# Patient Record
Sex: Female | Born: 1960 | Race: Black or African American | Hispanic: No | Marital: Single | Smoking: Never smoker
Health system: Southern US, Community
[De-identification: ages and names within clinical notes are randomized; demographics above are authoritative.]

## PROBLEM LIST (undated history)

## (undated) DIAGNOSIS — R51 Headache: Secondary | ICD-10-CM

## (undated) DIAGNOSIS — K219 Gastro-esophageal reflux disease without esophagitis: Secondary | ICD-10-CM

## (undated) DIAGNOSIS — E079 Disorder of thyroid, unspecified: Secondary | ICD-10-CM

## (undated) DIAGNOSIS — E669 Obesity, unspecified: Secondary | ICD-10-CM

## (undated) DIAGNOSIS — K5790 Diverticulosis of intestine, part unspecified, without perforation or abscess without bleeding: Secondary | ICD-10-CM

## (undated) DIAGNOSIS — R519 Headache, unspecified: Secondary | ICD-10-CM

## (undated) DIAGNOSIS — K648 Other hemorrhoids: Secondary | ICD-10-CM

## (undated) DIAGNOSIS — K8020 Calculus of gallbladder without cholecystitis without obstruction: Secondary | ICD-10-CM

## (undated) DIAGNOSIS — I10 Essential (primary) hypertension: Secondary | ICD-10-CM

## (undated) DIAGNOSIS — E785 Hyperlipidemia, unspecified: Secondary | ICD-10-CM

## (undated) DIAGNOSIS — H269 Unspecified cataract: Secondary | ICD-10-CM

## (undated) HISTORY — PX: CHOLECYSTECTOMY: SHX55

## (undated) HISTORY — DX: Diverticulosis of intestine, part unspecified, without perforation or abscess without bleeding: K57.90

## (undated) HISTORY — DX: Essential (primary) hypertension: I10

## (undated) HISTORY — DX: Unspecified cataract: H26.9

## (undated) HISTORY — DX: Calculus of gallbladder without cholecystitis without obstruction: K80.20

## (undated) HISTORY — DX: Disorder of thyroid, unspecified: E07.9

## (undated) HISTORY — DX: Headache, unspecified: R51.9

## (undated) HISTORY — DX: Hyperlipidemia, unspecified: E78.5

## (undated) HISTORY — DX: Gastro-esophageal reflux disease without esophagitis: K21.9

## (undated) HISTORY — PX: TONSILLECTOMY: SUR1361

## (undated) HISTORY — DX: Other hemorrhoids: K64.8

## (undated) HISTORY — DX: Obesity, unspecified: E66.9

## (undated) HISTORY — PX: COLONOSCOPY: SHX174

## (undated) HISTORY — DX: Headache: R51

---

## 1999-01-21 ENCOUNTER — Other Ambulatory Visit: Admission: RE | Admit: 1999-01-21 | Discharge: 1999-01-21 | Payer: Self-pay | Admitting: Internal Medicine

## 1999-12-27 ENCOUNTER — Encounter (INDEPENDENT_AMBULATORY_CARE_PROVIDER_SITE_OTHER): Payer: Self-pay | Admitting: Specialist

## 1999-12-27 ENCOUNTER — Encounter: Payer: Self-pay | Admitting: Internal Medicine

## 1999-12-28 ENCOUNTER — Encounter: Payer: Self-pay | Admitting: Surgery

## 1999-12-28 ENCOUNTER — Inpatient Hospital Stay (HOSPITAL_COMMUNITY): Admission: RE | Admit: 1999-12-28 | Discharge: 1999-12-30 | Payer: Self-pay | Admitting: Internal Medicine

## 2000-01-23 ENCOUNTER — Other Ambulatory Visit: Admission: RE | Admit: 2000-01-23 | Discharge: 2000-01-23 | Payer: Self-pay | Admitting: Internal Medicine

## 2000-04-23 ENCOUNTER — Emergency Department (HOSPITAL_COMMUNITY): Admission: EM | Admit: 2000-04-23 | Discharge: 2000-04-23 | Payer: Self-pay | Admitting: Emergency Medicine

## 2001-02-02 ENCOUNTER — Other Ambulatory Visit: Admission: RE | Admit: 2001-02-02 | Discharge: 2001-02-02 | Payer: Self-pay | Admitting: Internal Medicine

## 2002-02-03 ENCOUNTER — Other Ambulatory Visit: Admission: RE | Admit: 2002-02-03 | Discharge: 2002-02-03 | Payer: Self-pay | Admitting: Internal Medicine

## 2002-07-21 ENCOUNTER — Encounter: Payer: Self-pay | Admitting: Internal Medicine

## 2002-07-21 LAB — CONVERTED CEMR LAB

## 2003-02-07 ENCOUNTER — Other Ambulatory Visit: Admission: RE | Admit: 2003-02-07 | Discharge: 2003-02-07 | Payer: Self-pay | Admitting: Internal Medicine

## 2003-02-07 ENCOUNTER — Encounter: Payer: Self-pay | Admitting: Internal Medicine

## 2004-07-30 ENCOUNTER — Ambulatory Visit: Payer: Self-pay | Admitting: Internal Medicine

## 2004-09-06 ENCOUNTER — Ambulatory Visit: Payer: Self-pay | Admitting: Internal Medicine

## 2005-02-03 ENCOUNTER — Ambulatory Visit: Payer: Self-pay | Admitting: Internal Medicine

## 2005-02-12 ENCOUNTER — Ambulatory Visit: Payer: Self-pay | Admitting: Internal Medicine

## 2005-03-05 ENCOUNTER — Encounter: Admission: RE | Admit: 2005-03-05 | Discharge: 2005-03-05 | Payer: Self-pay | Admitting: Internal Medicine

## 2005-05-27 ENCOUNTER — Emergency Department (HOSPITAL_COMMUNITY): Admission: EM | Admit: 2005-05-27 | Discharge: 2005-05-27 | Payer: Self-pay | Admitting: Emergency Medicine

## 2005-09-10 ENCOUNTER — Ambulatory Visit: Payer: Self-pay | Admitting: Internal Medicine

## 2005-10-21 ENCOUNTER — Ambulatory Visit: Payer: Self-pay | Admitting: Internal Medicine

## 2006-01-20 LAB — CONVERTED CEMR LAB: Pap Smear: NORMAL

## 2006-02-05 ENCOUNTER — Ambulatory Visit: Payer: Self-pay | Admitting: Internal Medicine

## 2006-02-11 ENCOUNTER — Other Ambulatory Visit: Admission: RE | Admit: 2006-02-11 | Discharge: 2006-02-11 | Payer: Self-pay | Admitting: Internal Medicine

## 2006-02-11 ENCOUNTER — Ambulatory Visit: Payer: Self-pay | Admitting: Internal Medicine

## 2006-02-11 ENCOUNTER — Encounter: Payer: Self-pay | Admitting: Internal Medicine

## 2006-06-25 ENCOUNTER — Ambulatory Visit: Payer: Self-pay | Admitting: Internal Medicine

## 2006-12-02 ENCOUNTER — Ambulatory Visit: Payer: Self-pay | Admitting: Internal Medicine

## 2007-03-05 ENCOUNTER — Encounter: Payer: Self-pay | Admitting: Internal Medicine

## 2007-03-07 ENCOUNTER — Encounter: Payer: Self-pay | Admitting: Internal Medicine

## 2007-03-07 DIAGNOSIS — F329 Major depressive disorder, single episode, unspecified: Secondary | ICD-10-CM

## 2007-03-07 DIAGNOSIS — E785 Hyperlipidemia, unspecified: Secondary | ICD-10-CM

## 2007-03-07 DIAGNOSIS — J309 Allergic rhinitis, unspecified: Secondary | ICD-10-CM | POA: Insufficient documentation

## 2007-05-27 ENCOUNTER — Ambulatory Visit: Payer: Self-pay | Admitting: Internal Medicine

## 2007-05-27 LAB — CONVERTED CEMR LAB
ALT: 62 units/L — ABNORMAL HIGH (ref 0–35)
AST: 35 units/L (ref 0–37)
Basophils Absolute: 0 10*3/uL (ref 0.0–0.1)
Basophils Relative: 0.6 % (ref 0.0–1.0)
Bilirubin Urine: NEGATIVE
Chloride: 104 meq/L (ref 96–112)
Cholesterol: 228 mg/dL (ref 0–200)
Direct LDL: 152.9 mg/dL
Eosinophils Absolute: 0 10*3/uL (ref 0.0–0.6)
Eosinophils Relative: 0.5 % (ref 0.0–5.0)
GFR calc Af Amer: 117 mL/min
GFR calc non Af Amer: 97 mL/min
HCT: 38.6 % (ref 36.0–46.0)
HDL: 56.8 mg/dL (ref >39.0)
Hemoglobin, Urine: NEGATIVE
MCHC: 34.1 g/dL (ref 30.0–36.0)
MCV: 89.5 fL (ref 78.0–100.0)
Monocytes Absolute: 0.5 10*3/uL (ref 0.2–0.7)
Nitrite: NEGATIVE
Potassium: 4 meq/L (ref 3.5–5.1)
RDW: 13.2 % (ref 11.5–14.6)
TSH: 1.08 microintl units/mL (ref 0.35–5.50)
Total Bilirubin: 0.7 mg/dL (ref 0.3–1.2)
Total CHOL/HDL Ratio: 4
Urine Glucose: NEGATIVE mg/dL
VLDL: 21 mg/dL (ref 0–40)
WBC: 7.5 10*3/uL (ref 4.5–10.5)

## 2007-06-29 ENCOUNTER — Ambulatory Visit: Payer: Self-pay | Admitting: Internal Medicine

## 2007-07-02 ENCOUNTER — Ambulatory Visit (HOSPITAL_COMMUNITY): Admission: RE | Admit: 2007-07-02 | Discharge: 2007-07-02 | Payer: Self-pay | Admitting: Internal Medicine

## 2007-08-23 ENCOUNTER — Ambulatory Visit (HOSPITAL_COMMUNITY): Admission: RE | Admit: 2007-08-23 | Discharge: 2007-08-23 | Payer: Self-pay | Admitting: General Surgery

## 2007-09-02 ENCOUNTER — Encounter: Payer: Self-pay | Admitting: Internal Medicine

## 2008-04-04 ENCOUNTER — Other Ambulatory Visit: Admission: RE | Admit: 2008-04-04 | Discharge: 2008-04-04 | Payer: Self-pay | Admitting: Gynecology

## 2008-11-22 ENCOUNTER — Ambulatory Visit: Payer: Self-pay | Admitting: Internal Medicine

## 2008-11-22 DIAGNOSIS — R42 Dizziness and giddiness: Secondary | ICD-10-CM

## 2008-11-22 DIAGNOSIS — N912 Amenorrhea, unspecified: Secondary | ICD-10-CM | POA: Insufficient documentation

## 2008-11-22 LAB — CONVERTED CEMR LAB: Beta hcg, urine, semiquantitative: NEGATIVE

## 2009-08-08 ENCOUNTER — Ambulatory Visit: Payer: Self-pay | Admitting: Internal Medicine

## 2009-08-08 DIAGNOSIS — H6120 Impacted cerumen, unspecified ear: Secondary | ICD-10-CM | POA: Insufficient documentation

## 2009-08-08 DIAGNOSIS — J329 Chronic sinusitis, unspecified: Secondary | ICD-10-CM | POA: Insufficient documentation

## 2010-08-20 NOTE — Assessment & Plan Note (Signed)
Summary: sinus/ear pain, walkin/john/cd   Vital Signs:  Patient profile:   50 year old female Height:      61 inches (154.94 cm) Weight:      147.0 pounds (66.82 kg) O2 Sat:      98 % on Room air Temp:     98.4 degrees F (36.89 degrees C) oral Pulse rate:   68 / minute BP sitting:   92 / 62  (left arm) Cuff size:   regular  Vitals Entered By: Orlan Leavens (August 08, 2009 4:26 PM)  O2 Flow:  Room air CC: sinus & ear pain Is Patient Diabetic? No Pain Assessment Patient in pain? yes     Location: ears   Primary Care Provider:  Corwin Levins MD  CC:  sinus & ear pain.  History of Present Illness: here today with complaint of sinus pressure and ear pain L>R side. onset of symptoms was 2 weeks ago. course has been gradual onset. problem precipitated by change of waether (worse symptoms in the cold every year) symptom characterized as "mask like pressure" around eyes and behind nasal region symptom radiates to ears with pain d/t "fullness inside my head" problem associated with headache and thick nasal discharge but not associated with fever, hearing loss, rash or vision changes. symptoms improved by nothing - taking usual allg medicines w/o relief. symptoms worsened with bending head forward or down. + prior hx of same symptoms.  just came from gyn office who reports both ear full of wax and requests ears to be cleaned  Current Medications (verified): 1)  Cetirizine Hcl 10 Mg Tabs (Cetirizine Hcl) .Marland Kitchen.. 1 By Mouth Once Daily As Needed 2)  Omnaris 50 Mcg/act Susp (Ciclesonide) .... 2 Spray/side Once Daily 3)  Meclizine Hcl 12.5 Mg Tabs (Meclizine Hcl) .Marland Kitchen.. 1 -2  Poo Q 6 Hrs As Needed Dizziness 4)  Ortho Tri-Cyclen Lo 0.025 Mg Tabs (Norgestimate-Ethinyl Estradiol) .... Use As Directed  Allergies (verified): No Known Drug Allergies  Past History:  Past Medical History: Last updated: 06/29/2007 Hyperlipidemia Obesity Depression Allergic rhinitis transient  hyperthyroidism post partum  Review of Systems  The patient denies fever, vision loss, decreased hearing, hoarseness, chest pain, syncope, and abdominal pain.    Physical Exam  General:  alert and overweight-appearing., non toxic appearing Eyes:  vision grossly intact; pupils equal, round and reactive to light.  conjunctiva and lids normal.    Ears:  initially bilateral cerumen impaction - after cleansing and wax removal, B erythema without TM rupture or effusions Nose:  bilateral frontal and  maxillary sinus tenderness to palpation Mouth:  teeth and gums in good repair; mucous membranes moist, without lesions or ulcers. oropharynx clear without exudate, no erythema.  Lungs:  normal respiratory effort, no intercostal retractions or use of accessory muscles; normal breath sounds bilaterally - no crackles and no wheezes.    Heart:  normal rate, regular rhythm, no murmur, and no rub. BLE without edema. Additional Exam:  Procedure: ear irrigation, bilateral Reason: wax impaction Risk/Benefit were discussed with patient who agrees to proceed. both ears were irrigated with warm water. Large amount of wax was removed from each. Instrumentation with metal ear loop was performed to accomplish the wax removal. Patient tolerated procedure well. Complications: none    Impression & Recommendations:  Problem # 1:  UNSPECIFIED SINUSITIS (ICD-473.9)  Her updated medication list for this problem includes:    Omnaris 50 Mcg/act Susp (Ciclesonide) .Marland Kitchen... 2 spray/side once daily    Augmentin 500-125 Mg  Tabs (Amoxicillin-pot clavulanate) .Marland Kitchen... 1 by mouth two times a day x 7 days  Take antibiotics for full duration. Discussed treatment options including managment of allergic component  Orders: Prescription Created Electronically [phone])  Problem # 2:  CERUMEN IMPACTION, BILATERAL (ICD-380.4)  procedure - wax removal  Orders: Cerumen Impaction Removal (60454)  Complete Medication List: 1)   Cetirizine Hcl 10 Mg Tabs (Cetirizine hcl) .Marland Kitchen.. 1 by mouth once daily as needed 2)  Omnaris 50 Mcg/act Susp (Ciclesonide) .... 2 spray/side once daily 3)  Meclizine Hcl 12.5 Mg Tabs (Meclizine hcl) .Marland Kitchen.. 1 -2  poo q 6 hrs as needed dizziness 4)  Ortho Tri-cyclen Lo 0.025 Mg Tabs (Norgestimate-ethinyl estradiol) .... Use as directed 5)  Augmentin 500-125 Mg Tabs (Amoxicillin-pot clavulanate) .Marland Kitchen.. 1 by mouth two times a day x 7 days  Patient Instructions: 1)  it was good to see you today.  2)  your ears have been irrigated and cleaned today 3)  for your sinus symptoms - augmentin antibiotics and steroid nasal spray - your prescriptions have been electronically submitted to your pharmacy. Please take as directed. Contact our office if you believe you're having problems with the medication(s). 4)  continue with the cetirizine once daily   5)  Acute sinusitis symptoms: .Use warm moist compresses, and over the counter decongestants  (only as directed). Call if no improvement in 5-7 days, sooner if increasing pain, fever, or new symptoms. Prescriptions: AUGMENTIN 500-125 MG TABS (AMOXICILLIN-POT CLAVULANATE) 1 by mouth two times a day x 7 days  #14 x 0   Entered and Authorized by:   Newt Lukes MD   Signed by:   Newt Lukes MD on 08/08/2009   Method used:   Electronically to        [facility]  [phone]* (retail)       [address]       [place], [place]  [address]       Ph: [phone] or [phone]       Fax: [phone]   RxID:   [id number]

## 2010-11-04 ENCOUNTER — Ambulatory Visit
Admission: RE | Admit: 2010-11-04 | Discharge: 2010-11-04 | Disposition: A | Discharge status: Home / Self Care | Payer: Self-pay | Source: Ambulatory Visit | Attending: Orthopedic Surgery | Admitting: Orthopedic Surgery

## 2010-11-04 ENCOUNTER — Other Ambulatory Visit: Payer: Self-pay | Admitting: Orthopedic Surgery

## 2010-11-04 DIAGNOSIS — M79672 Pain in left foot: Secondary | ICD-10-CM

## 2010-12-03 NOTE — Op Note (Signed)
NAMETAJAE, MAIOLO NO.:  [id number]   MEDICAL RECORD NO.:  [id number]          PATIENT TYPE:  AMB   LOCATION:  DAY                          FACILITY:  [facility]   PHYSICIAN:  Lennie Muckle, MD      DATE OF BIRTH:  14-Oct-1960   DATE OF PROCEDURE:  08/23/2007  DATE OF DISCHARGE:  08/23/2007                               OPERATIVE REPORT   PREOPERATIVE DIAGNOSIS:  Left axillary abscess.   POSTOPERATIVE DIAGNOSIS:  Left axillary abscess.   PROCEDURE:  Incision and drainage of abscess.   SURGEON:  Lennie Muckle, M.D.   ANESTHESIA:  General endotracheal anesthesia as well as local anesthetic  with 0.25% Marcaine and 1% lidocaine.   SPECIMENS:  Cultures of left axillary fluid of abscess cavity and biopsy  of abscess cavity to microbiology.   ESTIMATED BLOOD LOSS:  Minimal.   COMPLICATIONS:  None immediate.   DRAINS:  None placed.   INDICATIONS FOR PROCEDURE:  The patient is a 50 year old female who had  axillary swelling for 4-5 days.  She was seen in the Urgent Care Clinic  on Saturday and had incision and drainage performed. However, she  continued to have pain and swelling in the left axilla as well as low  grade fevers.  She was placed on Septra and was seen in our urgent  clinic office due to increase in pain.  Examination was consistent with  a continued left axillary abscess.   DESCRIPTION OF PROCEDURE:  The patient is identified in the preoperative  holding suite.  The appropriate site was marked.  She received IV  Vancomycin and then taken to the operating room.  Once in the operating  room, she was placed in the supine position.  After administration of  general endotracheal anesthesia, her left axilla was prepped and draped  in the usual sterile fashion.  A timeout procedure identifying the  patient and procedure were performed.  Using a #10 blade, an incision  was placed directly over the area of swelling in the previous incision.  At first there  was just hardness and inflammatory tissue reaction found.  However, superiorly I was able to find the abscess cavity.  the purlent  fluid  was swabbed with culture swabs and sent to microbiology.  I also  biopsied the abscess cavity and sent that to microbiology.  The area was  irrigated.  Hemostasis obtained with electrocautery.  The wound site was  then injected with 1% lidocaine mixed with 0.25% Marcaine with  epinephrine.  Approximately 40 mL was used.  The wound bed was then  packed with 2 inch Curlex gauze.  Dry gauze and tape were placed as  final  dressing.  The patient was extubated and transported to postanesthesia  care unit in stable condition.  She will be given Percocet for pain and  is instructed to remove the packing starting tomorrow, change this  daily.  She will return to the clinic tomorrow if there is a problem  with her dressing change.  I will see her in approximately one week.  Lennie Muckle, MD  Electronically Signed     ALA/MEDQ  D:  08/23/2007  T:  08/24/2007  Job:  [id number]

## 2010-12-06 NOTE — Op Note (Signed)
[facility]  Patient:    Susan Mejia, Susan Mejia                     MRN: [id number] Proc. Date: 12/28/99 Adm. Date:  [id number] Disc. Date: [id number] Attending:  Tresa Garter CC:         Sonda Primes, M.D. [facility]                           Operative Report  ACCOUNT NUMBER:  [id number]  PREOPERATIVE DIAGNOSIS:  Acute calculus cholecystitis.  POSTOPERATIVE DIAGNOSIS:  Acute calculus cholecystitis.  OPERATION:  Laparoscopic cholecystectomy.  SURGEON:  Currie Paris, M.D.  ASSISTANT:  Abigail Miyamoto, M.D.  ANESTHESIA:  General endotracheal.  INDICATIONS:  This patient is a 50 year old with a history of abdominal pain localized into the right upper quadrant.  She has been having off and on mild episodes, but this one was more severe and associated with what appeared to be acute cholecystitis by CT and confirmed as a large single stone in the neck of the gallbladder by ultrasound.  Liver functions were normal.  DESCRIPTION OF PROCEDURE:  The patient was brought to the operating room and after satisfactory general endotracheal anesthesia had been obtained, the abdomen was prepped and draped.  Marcaine 0.25% was used for a local in each incision.  The umbilical incision, the fascia opened, and the peritoneal cavity entered under direct vision.  A Hasson was introduced and the abdomen insufflated at a pressure of 15.  The patient was placed on reversed Trendelenburg and tilted to the left.  Three additional cannulas were placed in the usual positions with a 10-11 in the epigastrium and two 5s laterally. The gallbladder was slightly edematous with more edema around the area of the common duct, but this was just a watery edema without any significant thickening.  Dissection was begun and the cystic duct was identified as was the cystic artery, which laid a little bit posteriorly.  I opened up the perineum on either side of the gallbladder and got a  good window to make sure that we had everything identified in the triangle of Calot and then clipped the cystic duct with two clips on the stay side and divided it.  The cystic artery was clipped using approximately four clips on the stay side, clipping two branches.  The gallbladder was then removed from below to above.  It was brought out the umbilical port.  The Hasson was reintroduced.  We irrigated to make sure everything was dry.  Once everything appeared to be dry, we removed the lateral ports.  There was no bleeding from those.  The umbilical port was removed and the fascia closed with a pursestring.  I put a fascial suture in the epigastric port because there was some physical muscle and I thought this would reduce the chance of a hernia.  The skin was closed with 4-0 Monocryl subcuticular plus Steri-Strips.  The patient tolerated the procedure well. There were no operative complications.  All counts were correct. DD:  12/28/99 TD:  01/01/00 Job: [id number]

## 2010-12-06 NOTE — Consult Note (Signed)
[facility]  Patient:    Susan Mejia, Susan Mejia                     MRN: [id number] Proc. Date: 12/28/99 Adm. Date:  [id number] Attending:  Tresa Garter CC:         Sonda Primes, M.D. [facility]                          Consultation Report  ACCOUNT:  [id number]  REASON FOR CONSULTATION:  Abdominal pain.  HISTORY OF PRESENT ILLNESS:  [name] is a 50 year old, admitted to the emergency room last night with acute abdominal pain.  The patient has had some diarrhea and actually more flank pain that has become quite severe, and she has developed vomiting.  She has had no fever or chills.  She had several other episodes like  this over the last several months, and has had a "nervous stomach" for several years.  Because of her abdominal pain, and it was unclear what it was, a CT was  done which was interpreted initially as showing acute cholecystitis.  She is seen in the emergency room for evaluation and possible surgery.  PAST MEDICAL HISTORY:  The patient is generally in good health.  No prior operations.  CURRENT MEDICATIONS:  Birth control pills.  ALLERGIES:  No known drug allergies.  SOCIAL HISTORY:  She does not smoke and drinks only occasional alcohol.  FAMILY HISTORY:  She does have a family history of diabetes mellitus.  PHYSICAL EXAMINATION:  GENERAL:  A healthy 50 year old in no distress.  She is alert and awake.  HEENT:  Head normocephalic.  Eyes are anicteric.  Pupils round and regular.  NECK:  Supple, no masses or thyromegaly.  LUNGS:  Clear to auscultation.  HEART:  Regular, no murmurs, rubs, or gallops.  ABDOMEN:  Some definite tenderness in the right upper quadrant, but no acute peritoneal signs.  Bowel sounds present.  RECTAL/PELVIC:  Not done.  EXTREMITIES:  Unremarkable.  LABORATORY DATA:  White count 9000.  Liver function tests are pending. Urinalysis is basically negative, except for slightly elevated specific  gravity of 10.25, indicating some mild volume depletion.  Hemoglobin 12.4.  Electrolytes normal.  Nonfasting glucose 118.  IMPRESSION:  Probable acute cholecystitis, based on CT.  PLAN:  After reviewing the CT, I think we will get a gallbladder ultrasound to  sure that this is what we think is a large stone in the neck of the gallbladder is indeed a stone, and not a loop of duodenum, with contrast.  In addition, the gallbladder is not particularly distended, and the white count is normal; so I think we need to confirm this a little bit further.  Assuming that is confirmatory, I think we will need to proceed to a laparoscopic cholecystectomy and to possible open, depending upon the surgical findings.  I have discussed the surgery, indications, risks, and complications with the patient, and she understands the need for surgery, and is willing to proceed. DD:  12/28/99 TD:  12/28/99 Job: [id number]

## 2011-02-05 ENCOUNTER — Other Ambulatory Visit: Payer: Self-pay | Admitting: Obstetrics and Gynecology

## 2011-04-11 LAB — ANAEROBIC CULTURE

## 2011-04-11 LAB — CULTURE, ROUTINE-ABSCESS

## 2011-04-11 LAB — GRAM STAIN

## 2011-04-11 LAB — TISSUE CULTURE

## 2011-04-11 LAB — HEMOGLOBIN AND HEMATOCRIT, BLOOD: HCT: 35.6 — ABNORMAL LOW

## 2011-04-11 LAB — PREGNANCY, URINE: Preg Test, Ur: NEGATIVE

## 2013-04-20 LAB — HM MAMMOGRAPHY

## 2013-04-26 ENCOUNTER — Other Ambulatory Visit: Payer: Self-pay | Admitting: Obstetrics and Gynecology

## 2013-04-28 ENCOUNTER — Encounter: Payer: Self-pay | Admitting: Internal Medicine

## 2013-07-05 ENCOUNTER — Ambulatory Visit (AMBULATORY_SURGERY_CENTER): Payer: Self-pay

## 2013-07-05 ENCOUNTER — Encounter: Payer: Self-pay | Admitting: Internal Medicine

## 2013-07-05 VITALS — Ht 61.0 in | Wt 169.8 lb

## 2013-07-05 DIAGNOSIS — Z1211 Encounter for screening for malignant neoplasm of colon: Secondary | ICD-10-CM

## 2013-07-05 MED ORDER — MOVIPREP 100 G PO SOLR: ORAL | Status: DC

## 2013-07-19 ENCOUNTER — Ambulatory Visit (AMBULATORY_SURGERY_CENTER): Payer: BC Managed Care – PPO | Admitting: Internal Medicine

## 2013-07-19 ENCOUNTER — Encounter: Payer: Self-pay | Admitting: Internal Medicine

## 2013-07-19 VITALS — BP 113/75 | HR 62 | Temp 98.0°F | Resp 18 | Ht 61.0 in | Wt 169.0 lb

## 2013-07-19 DIAGNOSIS — Z1211 Encounter for screening for malignant neoplasm of colon: Secondary | ICD-10-CM

## 2013-07-19 MED ORDER — SODIUM CHLORIDE 0.9 % IV SOLN: 500.0000 mL | INTRAVENOUS | Status: DC

## 2013-07-19 NOTE — Op Note (Signed)
[facility] [address] [place], [address]   COLONOSCOPY PROCEDURE REPORT  PATIENT: Susan Mejia, Junio  MR#: [id number] BIRTHDATE: July 13, 1961 , 52  yrs. old GENDER: Female ENDOSCOPIST: Beverley Fiedler, MD REFERRED XB:JYNWG John, M.D. PROCEDURE DATE:  07/19/2013 PROCEDURE:   Colonoscopy, screening First Screening Colonoscopy - Avg.  risk and is 50 yrs.  old or older Yes.  Prior Negative Screening - Now for repeat screening. N/A  History of Adenoma - Now for follow-up colonoscopy & has been > or = to 3 yrs.  N/A  Polyps Removed Today? No.  Recommend repeat exam, <10 yrs? No. ASA CLASS:   Class II INDICATIONS:average risk screening and first colonoscopy. MEDICATIONS: MAC sedation, administered by CRNA and propofol (Diprivan) 300mg  IV  DESCRIPTION OF PROCEDURE:   After the risks benefits and alternatives of the procedure were thoroughly explained, informed consent was obtained.  A digital rectal exam revealed no rectal mass.   The LB [id number]-[id number]  endoscope was introduced through the anus and advanced to the terminal ileum which was intubated for a short distance. No adverse events experienced.   The quality of the prep was good, using MoviPrep  The instrument was then slowly withdrawn as the colon was fully examined.   COLON FINDINGS: The mucosa appeared normal in the terminal ileum. Moderate diverticulosis was noted in the descending colon and sigmoid colon.   The colon mucosa was otherwise normal. Retroflexed views revealed small external hemorrhoids. The time to cecum=3 minutes 04 seconds.  Withdrawal time=11 minutes 04 seconds. The scope was withdrawn and the procedure completed.  COMPLICATIONS: There were no complications.  ENDOSCOPIC IMPRESSION: 1.   Normal mucosa in the terminal ileum 2.   Moderate diverticulosis was noted in the descending colon and sigmoid colon 3.   The colon mucosa was otherwise normal  RECOMMENDATIONS: 1.  High fiber  diet 2.  You should continue to follow colorectal cancer screening guidelines for "routine risk" patients with a repeat colonoscopy in 10 years.  There is no need for FOBT (stool) testing for at least 5 years.   eSigned:  Beverley Fiedler, MD 07/19/2013 10:32 AM   cc: The Patient and Corwin Levins, MD

## 2013-07-19 NOTE — Progress Notes (Signed)
Report to pacu rn, vss, bbs=clear 

## 2013-07-19 NOTE — Patient Instructions (Signed)

## 2013-07-20 ENCOUNTER — Telehealth: Payer: Self-pay | Admitting: *Deleted

## 2013-07-20 NOTE — Telephone Encounter (Signed)
LM on VM  Of number given in admitting that identifies pt by first name to return call if problems questions or concerns. ewm

## 2013-11-17 ENCOUNTER — Encounter: Payer: Self-pay | Admitting: Internal Medicine

## 2013-11-17 ENCOUNTER — Other Ambulatory Visit (INDEPENDENT_AMBULATORY_CARE_PROVIDER_SITE_OTHER): Payer: BC Managed Care – PPO

## 2013-11-17 ENCOUNTER — Ambulatory Visit (INDEPENDENT_AMBULATORY_CARE_PROVIDER_SITE_OTHER): Payer: BC Managed Care – PPO | Admitting: Internal Medicine

## 2013-11-17 VITALS — BP 160/110 | HR 80 | Temp 98.5°F | Ht 61.0 in | Wt 170.4 lb

## 2013-11-17 DIAGNOSIS — R079 Chest pain, unspecified: Secondary | ICD-10-CM | POA: Insufficient documentation

## 2013-11-17 DIAGNOSIS — Z0000 Encounter for general adult medical examination without abnormal findings: Secondary | ICD-10-CM

## 2013-11-17 DIAGNOSIS — Z0001 Encounter for general adult medical examination with abnormal findings: Secondary | ICD-10-CM | POA: Insufficient documentation

## 2013-11-17 DIAGNOSIS — I10 Essential (primary) hypertension: Secondary | ICD-10-CM

## 2013-11-17 DIAGNOSIS — Z00129 Encounter for routine child health examination without abnormal findings: Secondary | ICD-10-CM | POA: Insufficient documentation

## 2013-11-17 HISTORY — DX: Essential (primary) hypertension: I10

## 2013-11-17 LAB — BASIC METABOLIC PANEL
BUN: 11 mg/dL (ref 6–23)
CO2: 28 mEq/L (ref 19–32)
Calcium: 9.6 mg/dL (ref 8.4–10.5)
Chloride: 103 mEq/L (ref 96–112)
Creatinine, Ser: 0.8 mg/dL (ref 0.4–1.2)
GFR: 104.6 mL/min (ref >60.00)
Glucose, Bld: 89 mg/dL (ref 70–99)
POTASSIUM: 3.7 meq/L (ref 3.5–5.1)
SODIUM: 138 meq/L (ref 135–145)

## 2013-11-17 LAB — CBC WITH DIFFERENTIAL/PLATELET
Basophils Absolute: 0 10*3/uL (ref 0.0–0.1)
Basophils Relative: 0.3 % (ref 0.0–3.0)
EOS PCT: 0.5 % (ref 0.0–5.0)
Eosinophils Absolute: 0 10*3/uL (ref 0.0–0.7)
HCT: 40 % (ref 36.0–46.0)
Hemoglobin: 13.4 g/dL (ref 12.0–15.0)
LYMPHS ABS: 3.4 10*3/uL (ref 0.7–4.0)
Lymphocytes Relative: 38.5 % (ref 12.0–46.0)
MCHC: 33.3 g/dL (ref 30.0–36.0)
MCV: 88.1 fl (ref 78.0–100.0)
Monocytes Absolute: 0.5 10*3/uL (ref 0.1–1.0)
Monocytes Relative: 6 % (ref 3.0–12.0)
NEUTROS ABS: 4.8 10*3/uL (ref 1.4–7.7)
Neutrophils Relative %: 54.7 % (ref 43.0–77.0)
PLATELETS: 249 10*3/uL (ref 150.0–400.0)
RBC: 4.55 Mil/uL (ref 3.87–5.11)
RDW: 14.1 % (ref 11.5–14.6)
WBC: 8.8 10*3/uL (ref 4.5–10.5)

## 2013-11-17 LAB — URINALYSIS, ROUTINE W REFLEX MICROSCOPIC
BILIRUBIN URINE: NEGATIVE
Hgb urine dipstick: NEGATIVE
KETONES UR: NEGATIVE
Nitrite: NEGATIVE
PH: 5.5 (ref 5.0–8.0)
Specific Gravity, Urine: 1.025 (ref 1.000–1.030)
Total Protein, Urine: NEGATIVE
URINE GLUCOSE: NEGATIVE
Urobilinogen, UA: 0.2 (ref 0.0–1.0)

## 2013-11-17 LAB — LIPID PANEL
CHOLESTEROL: 233 mg/dL — AB (ref 0–200)
HDL: 60.6 mg/dL (ref >39.00)
LDL CALC: 154 mg/dL — AB (ref 0–99)
Total CHOL/HDL Ratio: 4
Triglycerides: 93 mg/dL (ref 0.0–149.0)
VLDL: 18.6 mg/dL (ref 0.0–40.0)

## 2013-11-17 LAB — HEPATIC FUNCTION PANEL
ALK PHOS: 79 U/L (ref 39–117)
ALT: 26 U/L (ref 0–35)
AST: 27 U/L (ref 0–37)
Albumin: 4.5 g/dL (ref 3.5–5.2)
Bilirubin, Direct: 0 mg/dL (ref 0.0–0.3)
Total Bilirubin: 0.7 mg/dL (ref 0.3–1.2)
Total Protein: 8 g/dL (ref 6.0–8.3)

## 2013-11-17 LAB — TSH: TSH: 1.06 u[IU]/mL (ref 0.35–5.50)

## 2013-11-17 MED ORDER — IRBESARTAN-HYDROCHLOROTHIAZIDE 300-12.5 MG PO TABS: 1.0000 | ORAL_TABLET | Freq: Every day | ORAL | Status: DC

## 2013-11-17 NOTE — Assessment & Plan Note (Signed)
For avalide 300-12.5, f/u 1 month

## 2013-11-17 NOTE — Assessment & Plan Note (Signed)

## 2013-11-17 NOTE — Patient Instructions (Addendum)
Please take all new medication as prescribed  Your EKG was OK today  Please continue all other medications as before, and refills have been done if requested. Please have the pharmacy call with any other refills you may need.  Please continue your efforts at being more active, low cholesterol diet, and weight control.  You are otherwise up to date with prevention measures today.  Please keep your appointments with your specialists as you may have planned  Please go to the LAB in the Basement (turn left off the elevator) for the tests to be done today  You will be contacted by phone if any changes need to be made immediately.  Otherwise, you will receive a letter about your results with an explanation, but please check with MyChart first.  Please remember to sign up for MyChart if you have not done so, as this will be important to you in the future with finding out test results, communicating by private email, and scheduling acute appointments online when needed.  Please return in 1 months, or sooner if needed

## 2013-11-17 NOTE — Progress Notes (Signed)
Pre visit review using our clinic review tool, if applicable. No additional management support is needed unless otherwise documented below in the visit note. 

## 2013-11-17 NOTE — Assessment & Plan Note (Signed)
Trace, fleeting, intermittent, pain free now, ECG reviewed as per emr, atypical, ok to follow for now

## 2013-11-17 NOTE — Progress Notes (Signed)
Subjective:    Patient ID: Susan Mejia, female    DOB: [birth year], 53 y.o.   MRN: [id number]  HPI  Here for wellness and re-establish;  Overall doing ok;  Pt denies CP, worsening SOB, DOE, wheezing, orthopnea, PND, worsening LE edema, palpitations, dizziness or syncope, except for small fleeting anterior chest pains without assoc symptoms for several wks.  Pt denies neurological change such as new headache, facial or extremity weakness.  Pt denies polydipsia, polyuria, or low sugar symptoms. Pt states overall good compliance with treatment and medications, good tolerability, and has been trying to follow lower cholesterol diet.  Pt denies worsening depressive symptoms, suicidal ideation or panic. No fever, night sweats, wt loss, loss of appetite, or other constitutional symptoms.  Pt states good ability with ADL's, has low fall risk, home safety reviewed and adequate, no other significant changes in hearing or vision, and only occasionally active with exercise. Plans to do more but working too much.  BP has been elevated in last few wks, despite trying to lose some wt Past Medical History  Diagnosis Date  . Hyperlipidemia     diet controlled  . Thyroid disease     hyperactive after pregancy, normal now, no meds  . Frequent headaches   . Hypertension    Past Surgical History  Procedure Laterality Date  . Cholecystectomy    . Tonsillectomy      reports that she has never smoked. She has never used smokeless tobacco. She reports that she does not drink alcohol or use illicit drugs. family history includes Cancer in her father; Diabetes in her other; Heart disease in her mother; Hypertension in her mother; Lung cancer in her father. There is no history of Colon cancer, Esophageal cancer, Rectal cancer, or Stomach cancer. No Known Allergies Current Outpatient Prescriptions on File Prior to Visit  Medication Sig Dispense Refill  . Melatonin 10 MG CAPS Take by mouth at bedtime.       No  current facility-administered medications on file prior to visit.   Review of Systems Constitutional: Negative for increased diaphoresis, other activity, appetite or other siginficant weight change  HENT: Negative for worsening hearing loss, ear pain, facial swelling, mouth sores and neck stiffness.   Eyes: Negative for other worsening pain, redness or visual disturbance.  Respiratory: Negative for shortness of breath and wheezing.   Cardiovascular: Negative for chest pain and palpitations.  Gastrointestinal: Negative for diarrhea, blood in stool, abdominal distention or other pain Genitourinary: Negative for hematuria, flank pain or change in urine volume.  Musculoskeletal: Negative for myalgias or other joint complaints.  Skin: Negative for color change and wound.  Neurological: Negative for syncope and numbness. other than noted Hematological: Negative for adenopathy. or other swelling Psychiatric/Behavioral: Negative for hallucinations, self-injury, decreased concentration or other worsening agitation.      Objective:   Physical Exam BP 160/110  Pulse 80  Temp(Src) 98.5 F (36.9 C) (Oral)  Ht 5\' 1"  (1.549 m)  Wt 170 lb 6 oz (77.282 kg)  BMI 32.21 kg/m2  SpO2 98% VS noted,  Constitutional: Pt is oriented to person, place, and time. Appears well-developed and well-nourished.  Head: Normocephalic and atraumatic.  Right Ear: External ear normal.  Left Ear: External ear normal.  Nose: Nose normal.  Mouth/Throat: Oropharynx is clear and moist.  Eyes: Conjunctivae and EOM are normal. Pupils are equal, round, and reactive to light.  Neck: Normal range of motion. Neck supple. No JVD present. No tracheal deviation present.  Cardiovascular: Normal rate, regular rhythm, normal heart sounds and intact distal pulses.   Pulmonary/Chest: Effort normal and breath sounds without rales or wheezing  Abdominal: Soft. Bowel sounds are normal. NT. No HSM  Musculoskeletal: Normal range of motion.  Exhibits no edema.  Lymphadenopathy:  Has no cervical adenopathy.  Neurological: Pt is alert and oriented to person, place, and time. Pt has normal reflexes. No cranial nerve deficit. Motor grossly intact Skin: Skin is warm and dry. No rash noted.  Psychiatric:  Has normal mood and affect. Behavior is normal.     Assessment & Plan:

## 2013-12-23 ENCOUNTER — Ambulatory Visit: Payer: BC Managed Care – PPO | Admitting: Internal Medicine

## 2013-12-29 ENCOUNTER — Ambulatory Visit: Payer: BC Managed Care – PPO | Admitting: Internal Medicine

## 2013-12-30 ENCOUNTER — Ambulatory Visit (INDEPENDENT_AMBULATORY_CARE_PROVIDER_SITE_OTHER): Payer: BC Managed Care – PPO | Admitting: Internal Medicine

## 2013-12-30 ENCOUNTER — Encounter: Payer: Self-pay | Admitting: Internal Medicine

## 2013-12-30 VITALS — BP 100/60 | HR 80 | Temp 98.0°F | Ht 61.0 in | Wt 170.0 lb

## 2013-12-30 DIAGNOSIS — Z0000 Encounter for general adult medical examination without abnormal findings: Secondary | ICD-10-CM

## 2013-12-30 DIAGNOSIS — E785 Hyperlipidemia, unspecified: Secondary | ICD-10-CM

## 2013-12-30 DIAGNOSIS — G4700 Insomnia, unspecified: Secondary | ICD-10-CM

## 2013-12-30 DIAGNOSIS — I10 Essential (primary) hypertension: Secondary | ICD-10-CM

## 2013-12-30 MED ORDER — ZOLPIDEM TARTRATE 10 MG PO TABS
10.0000 mg | ORAL_TABLET | Freq: Every evening | ORAL | Status: DC | PRN
Start: 2013-12-30 — End: 2014-07-23

## 2013-12-30 MED ORDER — IRBESARTAN-HYDROCHLOROTHIAZIDE 150-12.5 MG PO TABS: 1.0000 | ORAL_TABLET | Freq: Every day | ORAL | Status: DC

## 2013-12-30 NOTE — Progress Notes (Signed)
Pre visit review using our clinic review tool, if applicable. No additional management support is needed unless otherwise documented below in the visit note. 

## 2013-12-30 NOTE — Assessment & Plan Note (Signed)
Appears overcontrolled, to take half of her current Bp med supply until done, then change to avalide 150/12.5,  to f/u any worsening symptoms or concerns

## 2013-12-30 NOTE — Patient Instructions (Signed)
OK to take half of the current BP pill until gone , and then  Please take all new medication as prescribed - the avalide 150/12.5mg  per day  Please continue all other medications as before, and refills have been done if requested - the ambien  Please have the pharmacy call with any other refills you may need.  Please continue your efforts at being more active, low cholesterol diet, and weight control.

## 2013-12-30 NOTE — Assessment & Plan Note (Signed)
D/w pt, declines statin, for lower chol diet  Lab Results  Component Value Date   LDLCALC 154* 11/17/2013

## 2013-12-30 NOTE — Progress Notes (Signed)
   Subjective:    Patient ID: Susan Mejia, female    DOB: 07-05-1961, 53 y.o.   MRN: [id number]  HPI  Here to f/u; overall doing ok,  Pt denies chest pain, increased sob or doe, wheezing, orthopnea, PND, increased LE swelling, palpitations or syncope.  Pt denies polydipsia, polyuria, or low sugar symptoms such as weakness or confusion improved with po intake.  Pt denies new neurological symptoms such as new headache, or facial or extremity weakness or numbness.   Pt states overall good compliance with meds, has been trying to follow lower cholesterol diet, with wt overall stable, now going to the gym three times weekly.  [employer] BP with sbp 90. Had some dizziness and fatigue in the past wk, worse with standing, no falls or syncope. Past Medical History  Diagnosis Date  . Hyperlipidemia     diet controlled  . Thyroid disease     hyperactive after pregancy, normal now, no meds  . Frequent headaches   . Hypertension   . Essential hypertension, benign 11/17/2013   Past Surgical History  Procedure Laterality Date  . Cholecystectomy    . Tonsillectomy      reports that she has never smoked. She has never used smokeless tobacco. She reports that she does not drink alcohol or use illicit drugs. family history includes Cancer in her father; Diabetes in her other; Heart disease in her mother; Hypertension in her mother; Lung cancer in her father. There is no history of Colon cancer, Esophageal cancer, Rectal cancer, or Stomach cancer. No Known Allergies Current Outpatient Prescriptions on File Prior to Visit  Medication Sig Dispense Refill  . irbesartan-hydrochlorothiazide (AVALIDE) 300-12.5 MG per tablet Take 1 tablet by mouth daily.  90 tablet  3  . Melatonin 10 MG CAPS Take by mouth at bedtime.       No current facility-administered medications on file prior to visit.   Review of Systems  Constitutional: Negative for unusual diaphoresis or other sweats  HENT: Negative for ringing in  ear Eyes: Negative for double vision or worsening visual disturbance.  Respiratory: Negative for choking and stridor.   Gastrointestinal: Negative for vomiting or other signifcant bowel change Genitourinary: Negative for hematuria or decreased urine volume.  Musculoskeletal: Negative for other MSK pain or swelling Skin: Negative for color change and worsening wound.  Neurological: Negative for tremors and numbness other than noted  Psychiatric/Behavioral: Negative for decreased concentration or agitation other than above       Objective:   Physical Exam BP 100/60  Pulse 80  Temp(Src) 98 F (36.7 C) (Oral)  Ht 5\' 1"  (1.549 m)  Wt 170 lb (77.111 kg)  BMI 32.14 kg/m2  SpO2 96% VS noted,  Constitutional: Pt appears well-developed, well-nourished.  HENT: Head: NCAT.  Right Ear: External ear normal.  Left Ear: External ear normal.  Eyes: . Pupils are equal, round, and reactive to light. Conjunctivae and EOM are normal Neck: Normal range of motion. Neck supple.  Cardiovascular: Normal rate and regular rhythm.   Pulmonary/Chest: Effort normal and breath sounds normal.  Neurological: Pt is alert. Not confused , motor grossly intact Skin: Skin is warm. No rash Psychiatric: Pt behavior is normal. No agitation.     Assessment & Plan:

## 2013-12-30 NOTE — Assessment & Plan Note (Signed)
Ok for [employer] refill,  to f/u any worsening symptoms or concerns

## 2013-12-31 ENCOUNTER — Telehealth: Payer: Self-pay | Admitting: Internal Medicine

## 2013-12-31 NOTE — Telephone Encounter (Signed)
Relevant patient education mailed to patient.  

## 2014-04-28 ENCOUNTER — Ambulatory Visit (INDEPENDENT_AMBULATORY_CARE_PROVIDER_SITE_OTHER): Payer: BC Managed Care – PPO | Admitting: Family

## 2014-04-28 ENCOUNTER — Encounter: Payer: Self-pay | Admitting: Family

## 2014-04-28 VITALS — BP 100/60 | HR 84 | Temp 98.3°F | Resp 18 | Ht 61.0 in | Wt 176.4 lb

## 2014-04-28 DIAGNOSIS — G43809 Other migraine, not intractable, without status migrainosus: Secondary | ICD-10-CM | POA: Diagnosis not present

## 2014-04-28 DIAGNOSIS — G43909 Migraine, unspecified, not intractable, without status migrainosus: Secondary | ICD-10-CM | POA: Insufficient documentation

## 2014-04-28 MED ORDER — KETOROLAC TROMETHAMINE 60 MG/2ML IM SOLN
60.0000 mg | Freq: Once | INTRAMUSCULAR | Status: AC
  Administered 2014-04-28: 60 mg via INTRAMUSCULAR

## 2014-04-28 NOTE — Progress Notes (Signed)
Pre visit review using our clinic review tool, if applicable. No additional management support is needed unless otherwise documented below in the visit note. 

## 2014-04-28 NOTE — Progress Notes (Signed)
   Subjective:    Patient ID: Susan Mejia, female    DOB: 13-Nov-1960, 53 y.o.   MRN: [id number]  HPI:  Susan Mejia is a 53 y.o. female who presents today for headaches. Renato Gails felt sharp pains in her head that felt like electricity. Sensitivity to sound not as bad, had some nausea and vomiting. Head was throbbing Took a couple of advil-PM which helped a little bit. Currently has a headache. Headache frequency decreased when started to take a blood pressure medications. Headache has improved, but is still present.   No Known Allergies  Current Outpatient Prescriptions on File Prior to Visit  Medication Sig Dispense Refill  . irbesartan-hydrochlorothiazide (AVALIDE) 150-12.5 MG per tablet Take 1 tablet by mouth daily.  90 tablet  3  . Melatonin 10 MG CAPS Take by mouth at bedtime.      Marland Kitchen zolpidem (AMBIEN) 10 MG tablet Take 1 tablet (10 mg total) by mouth at bedtime as needed for sleep.  30 tablet  5   No current facility-administered medications on file prior to visit.   Past Medical History  Diagnosis Date  . Hyperlipidemia     diet controlled  . Thyroid disease     hyperactive after pregancy, normal now, no meds  . Frequent headaches   . Hypertension   . Essential hypertension, benign 11/17/2013    Review of Systems  See HPI    Objective:     BP 100/60  Pulse 84  Temp(Src) 98.3 F (36.8 C) (Oral)  Resp 18  Ht 5\' 1"  (1.549 m)  Wt 176 lb 6.4 oz (80.015 kg)  BMI 33.35 kg/m2  SpO2 95% Nursing note and vital signs reviewed.  Physical Exam  Constitutional: She is oriented to person, place, and time. She appears well-developed and well-nourished.  HENT:  Head: Normocephalic.  Right Ear: External ear normal.  Left Ear: External ear normal.  Nose: Nose normal.  Mouth/Throat: No oropharyngeal exudate.  Neck: Neck supple.  Cardiovascular: Normal rate, regular rhythm and normal heart sounds.   Pulmonary/Chest: Effort normal and breath sounds normal.    Neurological: She is alert and oriented to person, place, and time. No cranial nerve deficit. Coordination normal.  Skin: Skin is warm and dry.  Psychiatric: She has a normal mood and affect. Her behavior is normal. Judgment and thought content normal.        Assessment & Plan:

## 2014-04-28 NOTE — Assessment & Plan Note (Signed)
Symptoms are consistent with migraine headache. Give Toradol 60 mg IM. Discussed migraine prevention and signs and symptoms. Will attempt OTC treatment first with Excedrin migraine. Consider starting a triptan medication if no relief.

## 2014-04-28 NOTE — Patient Instructions (Addendum)
Thank you for choosing [facility].  Summary/Instructions:   If you experience any additional headaches, as discussed please try OTC Excedrine migraine. If you do not experience relief, please let us know and we can try Imitrex.   Please follow up as needed.  Migraine Headache A migraine headache is an intense, throbbing pain on one or both sides of your head. A migraine can last for 30 minutes to several hours. CAUSES  The exact cause of a migraine headache is not always known. However, a migraine may be caused when nerves in the brain become irritated and release chemicals that cause inflammation. This causes pain. Certain things may also trigger migraines, such as:  Alcohol.  Smoking.  Stress.  Menstruation.  Aged cheeses.  Foods or drinks that contain nitrates, glutamate, aspartame, or tyramine.  Lack of sleep.  Chocolate.  Caffeine.  Hunger.  Physical exertion.  Fatigue.  Medicines used to treat chest pain (nitroglycerine), birth control pills, estrogen, and some blood pressure medicines. SIGNS AND SYMPTOMS  Pain on one or both sides of your head.  Pulsating or throbbing pain.  Severe pain that prevents daily activities.  Pain that is aggravated by any physical activity.  Nausea, vomiting, or both.  Dizziness.  Pain with exposure to bright lights, loud noises, or activity.  General sensitivity to bright lights, loud noises, or smells. Before you get a migraine, you may get warning signs that a migraine is coming (aura). An aura may include:  Seeing flashing lights.  Seeing bright spots, halos, or zigzag lines.  Having tunnel vision or blurred vision.  Having feelings of numbness or tingling.  Having trouble talking.  Having muscle weakness. DIAGNOSIS  A migraine headache is often diagnosed based on:  Symptoms.  Physical exam.  A CT scan or MRI of your head. These imaging tests cannot diagnose migraines, but they can help rule  out other causes of headaches. TREATMENT Medicines may be given for pain and nausea. Medicines can also be given to help prevent recurrent migraines.  HOME CARE INSTRUCTIONS  Only take over-the-counter or prescription medicines for pain or discomfort as directed by your health care provider. The use of long-term narcotics is not recommended.  Lie down in a dark, quiet room when you have a migraine.  Keep a journal to find out what may trigger your migraine headaches. For example, write down:  What you eat and drink.  How much sleep you get.  Any change to your diet or medicines.  Limit alcohol consumption.  Quit smoking if you smoke.  Get 7-9 hours of sleep, or as recommended by your health care provider.  Limit stress.  Keep lights dim if bright lights bother you and make your migraines worse. SEEK IMMEDIATE MEDICAL CARE IF:   Your migraine becomes severe.  You have a fever.  You have a stiff neck.  You have vision loss.  You have muscular weakness or loss of muscle control.  You start losing your balance or have trouble walking.  You feel faint or pass out.  You have severe symptoms that are different from your first symptoms. MAKE SURE YOU:   Understand these instructions.  Will watch your condition.  Will get help right away if you are not doing well or get worse. Document Released: 07/07/2005 Document Revised: 11/21/2013 Document Reviewed: 03/14/2013 [facility] Patient Information 2015 [place], [place]. This information is not intended to replace advice given to you by your health care provider. Make sure you discuss any questions you  have with your health care provider.

## 2014-07-23 ENCOUNTER — Other Ambulatory Visit: Payer: Self-pay | Admitting: Internal Medicine

## 2014-07-25 NOTE — Telephone Encounter (Signed)
Faxed hardcopy for zolpidem to [facility]

## 2014-07-25 NOTE — Telephone Encounter (Signed)
Done hardcopy to [facility]  

## 2014-07-31 ENCOUNTER — Telehealth: Payer: Self-pay | Admitting: Internal Medicine

## 2014-07-31 NOTE — Telephone Encounter (Signed)
Patient is requesting refill on ambein sent to pharmacy

## 2014-08-01 MED ORDER — ZOLPIDEM TARTRATE 10 MG PO TABS: ORAL_TABLET | ORAL | Status: DC

## 2014-08-01 NOTE — Telephone Encounter (Signed)
See below

## 2014-08-01 NOTE — Telephone Encounter (Signed)
Done hardcopy to [employer] [facility]

## 2014-08-01 NOTE — Telephone Encounter (Signed)
Script faxed to [facility]. JG//CMA

## 2014-08-22 ENCOUNTER — Other Ambulatory Visit (INDEPENDENT_AMBULATORY_CARE_PROVIDER_SITE_OTHER): Payer: BC Managed Care – PPO

## 2014-08-22 ENCOUNTER — Ambulatory Visit (INDEPENDENT_AMBULATORY_CARE_PROVIDER_SITE_OTHER): Payer: BC Managed Care – PPO | Admitting: Internal Medicine

## 2014-08-22 ENCOUNTER — Encounter: Payer: Self-pay | Admitting: Internal Medicine

## 2014-08-22 ENCOUNTER — Ambulatory Visit (INDEPENDENT_AMBULATORY_CARE_PROVIDER_SITE_OTHER)
Admission: RE | Admit: 2014-08-22 | Discharge: 2014-08-22 | Disposition: A | Discharge status: Home / Self Care | Payer: BC Managed Care – PPO | Source: Ambulatory Visit | Attending: Internal Medicine | Admitting: Internal Medicine

## 2014-08-22 VITALS — BP 102/72 | HR 83 | Temp 98.1°F | Ht 61.0 in | Wt 181.0 lb

## 2014-08-22 DIAGNOSIS — E785 Hyperlipidemia, unspecified: Secondary | ICD-10-CM

## 2014-08-22 DIAGNOSIS — I10 Essential (primary) hypertension: Secondary | ICD-10-CM

## 2014-08-22 DIAGNOSIS — R079 Chest pain, unspecified: Secondary | ICD-10-CM

## 2014-08-22 DIAGNOSIS — R9431 Abnormal electrocardiogram [ECG] [EKG]: Secondary | ICD-10-CM

## 2014-08-22 LAB — BASIC METABOLIC PANEL
BUN: 18 mg/dL (ref 6–23)
CHLORIDE: 103 meq/L (ref 96–112)
CO2: 31 meq/L (ref 19–32)
CREATININE: 0.89 mg/dL (ref 0.40–1.20)
Calcium: 10.1 mg/dL (ref 8.4–10.5)
GFR: 85.59 mL/min (ref >60.00)
Glucose, Bld: 79 mg/dL (ref 70–99)
POTASSIUM: 3.8 meq/L (ref 3.5–5.1)
Sodium: 138 mEq/L (ref 135–145)

## 2014-08-22 LAB — CBC WITH DIFFERENTIAL/PLATELET
BASOS ABS: 0 10*3/uL (ref 0.0–0.1)
Basophils Relative: 0.4 % (ref 0.0–3.0)
EOS ABS: 0.1 10*3/uL (ref 0.0–0.7)
EOS PCT: 0.8 % (ref 0.0–5.0)
HCT: 38.5 % (ref 36.0–46.0)
Hemoglobin: 13.1 g/dL (ref 12.0–15.0)
Lymphocytes Relative: 25 % (ref 12.0–46.0)
Lymphs Abs: 2.4 10*3/uL (ref 0.7–4.0)
MCHC: 34.2 g/dL (ref 30.0–36.0)
MCV: 85.3 fl (ref 78.0–100.0)
MONOS PCT: 7.6 % (ref 3.0–12.0)
Monocytes Absolute: 0.7 10*3/uL (ref 0.1–1.0)
NEUTROS ABS: 6.4 10*3/uL (ref 1.4–7.7)
Neutrophils Relative %: 66.2 % (ref 43.0–77.0)
Platelets: 237 10*3/uL (ref 150.0–400.0)
RBC: 4.51 Mil/uL (ref 3.87–5.11)
RDW: 14.5 % (ref 11.5–15.5)
WBC: 9.7 10*3/uL (ref 4.0–10.5)

## 2014-08-22 LAB — TROPONIN I

## 2014-08-22 NOTE — Progress Notes (Signed)
Pre visit review using our clinic review tool, if applicable. No additional management support is needed unless otherwise documented below in the visit note. 

## 2014-08-22 NOTE — Patient Instructions (Signed)
Your next office appointment will be determined based upon review of your pending labs & x-rays. Those instructions will be transmitted to you  by mail Critical values will be called  Followup as needed for your acute issue. Please report any significant change in your symptoms.   Please start Xarelto 15 mg  Samples twice a day if we call you to do so (Lot [id number]; Exp 9/17)

## 2014-08-22 NOTE — Progress Notes (Signed)
   Subjective:    Patient ID: Susan Mejia, female    DOB: 05/19/1961, 54 y.o.   MRN: [id number]  HPI  Symptoms began 08/20/14 as heaviness in the entire left thorax. This was present upon awakening and was as severe as a level X for 60 minutes. She took 2 Advil PM with some benefit.  The chest pain seems to radiate posteriorly to the left scapula. The pain is worse with deep breathing or position change. It is associated with shortness of breath.She had to splint with respirations to prevent exacerbating the pain .  Prior to onset of the pain she had ridden 5 hours to [place] as a passenger.  She's continued to have discomfort in the left which is slightly better but still persistent.  She has had some dyspepsia for which she has  taken ginger ale.  Last week she had some discomfort in the upper quadrants bilaterally.  She believes that she may have had very dark stool recently. She does not exercise on a regular basis. She is a nonsmoker.  She denies a personal or family history of clotting disorders. Her maternal grandmother had stroke over age 33. There is no family history heart attack.  She has a history of dyslipidemia; she's never been on a statin.  She has been compliant with her blood pressure medications. She is not monitoring her blood pressure.  Last menses were 4 years ago.  Review of Systems She denies fever, chills, or sweats. She's had no dysphagia. She denies any unexplained weight loss. There's been no significant cough, hemoptysis or significant sputum. She describes intermittent scant thick sputum.  She also denies swelling or pain in the calves.    Objective:   Physical Exam  Pertinent or positive findings include: There is discomfort to chest compression as well as to percussion over the thoracic spine. Posterior tibial pulses slightly decreased.  General appearance :adequately nourished; in no distress. Eyes: No conjunctival inflammation or scleral  icterus is present. Oral exam: Dental hygiene is good. Lips and gums are healthy appearing.There is no oropharyngeal erythema or exudate noted.  Heart:  Normal rate and regular rhythm. S1 and S2 normal without gallop, murmur, click, rub or other extra sounds   Lungs:Chest clear to auscultation; no wheezes, rhonchi,rales ,or rubs present.No increased work of breathing.  Abdomen: bowel sounds normal, soft and non-tender without masses, organomegaly or hernias noted.  No guarding or rebound.  Vascular : all pulses equal ; no bruits present. Skin:Warm & dry.  Intact without suspicious lesions or rashes ; no  tenting Lymphatic: No lymphadenopathy is noted about the head, neck, axilla Neuro: Strength, tone & DTRs normal.        Assessment & Plan:  #1 atypical chest pain #2 poor R wave progression in V leads with  Subtle new NS ST-T changes V2-3 vs 10/2013  See orders

## 2014-08-23 LAB — D-DIMER, QUANTITATIVE: D-Dimer, Quant: 0.3 ug/mL-FEU (ref 0.00–0.48)

## 2014-09-14 ENCOUNTER — Other Ambulatory Visit: Payer: Self-pay | Admitting: Internal Medicine

## 2014-09-14 ENCOUNTER — Telehealth: Payer: Self-pay | Admitting: Internal Medicine

## 2014-09-14 DIAGNOSIS — E785 Hyperlipidemia, unspecified: Secondary | ICD-10-CM

## 2014-09-14 DIAGNOSIS — R0789 Other chest pain: Secondary | ICD-10-CM

## 2014-09-14 DIAGNOSIS — R9431 Abnormal electrocardiogram [ECG] [EKG]: Secondary | ICD-10-CM

## 2014-09-14 NOTE — Telephone Encounter (Signed)
Orders entered for fasting labs for NMR Lipoprofile The Stress Test referral will be scheduled and you'll be notified of the time.Please call the Referral Co-Ordinator @ [phone] if you have not been notified of appointment time within 7-10 days.

## 2014-09-14 NOTE — Telephone Encounter (Signed)
Patient called in.  She states she is ok with having the cholesterol panel and stress test done.

## 2014-09-14 NOTE — Telephone Encounter (Signed)
Patient advised to come for labwork any day after 7:30am, and after fasting for 8 hours.  A referral coordinator will be calling patient to schedule stress test.

## 2014-09-15 ENCOUNTER — Other Ambulatory Visit: Payer: BC Managed Care – PPO

## 2014-09-15 ENCOUNTER — Other Ambulatory Visit: Payer: Self-pay | Admitting: Internal Medicine

## 2014-09-18 LAB — NMR LIPOPROFILE WITH LIPIDS
Cholesterol, Total: 224 mg/dL — ABNORMAL HIGH (ref 100–199)
HDL Particle Number: 33.5 umol/L (ref >=30.5)
HDL Size: 8.7 nm — ABNORMAL LOW (ref >=9.2)
HDL-C: 59 mg/dL (ref >39)
LARGE HDL: 4.5 umol/L — AB (ref >=4.8)
LDL CALC: 147 mg/dL — AB (ref 0–99)
LDL Particle Number: 1948 nmol/L — ABNORMAL HIGH (ref <1000)
LDL Size: 21 nm (ref >=20.8)
LP-IR Score: 51 — ABNORMAL HIGH (ref <=45)
Large VLDL-P: 1.1 nmol/L (ref <=2.7)
Small LDL Particle Number: 885 nmol/L — ABNORMAL HIGH (ref <=527)
Triglycerides: 91 mg/dL (ref 0–149)
VLDL Size: 46.3 nm (ref <=46.6)

## 2014-09-20 ENCOUNTER — Ambulatory Visit (INDEPENDENT_AMBULATORY_CARE_PROVIDER_SITE_OTHER): Payer: BC Managed Care – PPO | Admitting: Internal Medicine

## 2014-09-20 ENCOUNTER — Encounter: Payer: Self-pay | Admitting: Internal Medicine

## 2014-09-20 ENCOUNTER — Other Ambulatory Visit (INDEPENDENT_AMBULATORY_CARE_PROVIDER_SITE_OTHER): Payer: BC Managed Care – PPO

## 2014-09-20 VITALS — BP 100/70 | HR 70 | Temp 98.3°F | Ht 61.0 in | Wt 182.5 lb

## 2014-09-20 DIAGNOSIS — I10 Essential (primary) hypertension: Secondary | ICD-10-CM

## 2014-09-20 DIAGNOSIS — E785 Hyperlipidemia, unspecified: Secondary | ICD-10-CM

## 2014-09-20 DIAGNOSIS — R000 Tachycardia, unspecified: Secondary | ICD-10-CM

## 2014-09-20 DIAGNOSIS — R42 Dizziness and giddiness: Secondary | ICD-10-CM

## 2014-09-20 LAB — TSH: TSH: 1.15 u[IU]/mL (ref 0.35–4.50)

## 2014-09-20 LAB — T3, FREE: T3, Free: 3.6 pg/mL (ref 2.3–4.2)

## 2014-09-20 LAB — T4, FREE: Free T4: 0.98 ng/dL (ref 0.60–1.60)

## 2014-09-20 NOTE — Progress Notes (Signed)
Pre visit review using our clinic review tool, if applicable. No additional management support is needed unless otherwise documented below in the visit note. 

## 2014-09-20 NOTE — Patient Instructions (Signed)
Risk of premature heart attack or stroke increases as LDL or BAD cholesterol rises, especially if there is family history of heart attack in males before 33 or women before 44. Based on this advanced testing, your LDL goal is <100 , ideally < 70 Your present LDL increases long term heart attack or stroke risk 47 %.   Please follow a Mediaterranean type diet  (many good cook books readily available) or review Dr Nunzio Cory book Eat, [place] for best  dietary cholesterol information & options.    Cardiovascular exercise, this can be as simple a program as walking, is recommended 30-45 minutes 3-4 times per week AFTER the stress hormone issue is addressed & stress test performed. As you're not exercising you should take 6-8 weeks to build up to this level.   If you cannot make significant changes in exercise and nutrition; statin medication at low dose would be the best therapeutic option to reduce your long term risk.

## 2014-09-20 NOTE — Progress Notes (Signed)
   Subjective:    Patient ID: Susan Mejia, female    DOB: [birth year], 54 y.o.   MRN: [id number]  HPI She was seen at their [facility] for dizziness. Blood pressure was found to be 178/117 with a pulse of 112. She was referred to [facility] ER. She received parenteral lorazepam and hydralazine as well as oral clonidine. Blood pressure dropped to 130 over?Marland Kitchen    Maternal grandmother had stroke at 62.  The advanced cholesterol testing was reviewed and risks and options discussed.  Review of Systems She does describe intermittent chest pain, palpitations, flushing, diarrhea; these do not occur as a distinct constellation.     Objective:   Physical Exam Appears healthy and well-nourished & in no acute distress  No carotid bruits are present.No neck vein distention present at 10 - 15 degrees.  Thyroid is full without nodularity or induration.   Heart rhythm and rate are normal with no gallop or murmur  Chest is clear with no increased work of breathing  There is no evidence of aortic aneurysm or renal artery bruits  Abdomen soft with no organomegaly or masses. No HJR  No clubbing, cyanosis or edema present.  Pedal pulses are intact   No ischemic skin changes are present . Fingernails healthy   Alert and oriented. Strength, tone, DTRs reflexes normal       Assessment & Plan:  1 paroxysmal hypertension  #2 dizziness associated with #1  #3 tachycardia so she with #1  Plan rule out hyperthyroidism and pheochromocytoma. If these are negative  stress test will be performed as prelude to CVE. Nutritional interventions for the dyslipidemia were discussed.

## 2014-09-22 ENCOUNTER — Other Ambulatory Visit: Payer: BC Managed Care – PPO

## 2014-09-22 DIAGNOSIS — I10 Essential (primary) hypertension: Secondary | ICD-10-CM

## 2014-09-22 DIAGNOSIS — R000 Tachycardia, unspecified: Secondary | ICD-10-CM

## 2014-09-24 ENCOUNTER — Other Ambulatory Visit: Payer: Self-pay | Admitting: Internal Medicine

## 2014-09-24 LAB — METHYLMALONIC ACID, SERUM: Methylmalonic Acid, Quant: 95 nmol/L (ref 87–318)

## 2014-09-27 LAB — CATECHOLAMINES, FRACTIONATED, URINE, 24 HOUR
CALCULATED TOTAL (E+ NE): 47 ug/(24.h) (ref 26–121)
CREATININE, URINE MG/DAY-CATEUR: 1.4 g/(24.h) (ref 0.63–2.50)
Dopamine, 24 hr Urine: 261 mcg/24 h (ref 52–480)
EPINEPHRINE, 24 HR URINE: 4 ug/(24.h) (ref 2–24)
NOREPINEPHRINE, 24 HR UR: 43 ug/(24.h) (ref 15–100)
Total Volume - CF 24Hr U: 800 mL

## 2014-12-08 ENCOUNTER — Encounter: Payer: BC Managed Care – PPO | Admitting: Nurse Practitioner

## 2014-12-08 ENCOUNTER — Ambulatory Visit (INDEPENDENT_AMBULATORY_CARE_PROVIDER_SITE_OTHER): Payer: BC Managed Care – PPO

## 2014-12-08 ENCOUNTER — Encounter: Payer: Self-pay | Admitting: Nurse Practitioner

## 2014-12-08 DIAGNOSIS — R9431 Abnormal electrocardiogram [ECG] [EKG]: Secondary | ICD-10-CM | POA: Diagnosis not present

## 2014-12-08 DIAGNOSIS — R0789 Other chest pain: Secondary | ICD-10-CM

## 2014-12-08 LAB — EXERCISE TOLERANCE TEST
CHL CUP RESTING HR STRESS: 62 {beats}/min
CHL CUP STRESS STAGE 2 GRADE: 0 %
CHL CUP STRESS STAGE 2 SPEED: 1 mph
CHL CUP STRESS STAGE 4 DBP: 109 mmHg
CHL CUP STRESS STAGE 4 SBP: 156 mmHg
CHL CUP STRESS STAGE 5 HR: 123 {beats}/min
CHL CUP STRESS STAGE 5 SPEED: 2.5 mph
CHL CUP STRESS STAGE 7 SBP: 104 mmHg
CHL CUP STRESS STAGE 8 GRADE: 0 %
CHL CUP STRESS STAGE 8 HR: 72 {beats}/min
CHL CUP STRESS STAGE 8 SPEED: 0 mph
CSEPEDS: 22 s
CSEPHR: 92 %
Estimated workload: 9 METS
Exercise duration (min): 7 min
MPHR: 168 {beats}/min
Peak BP: 113 mmHg
Peak HR: 153 {beats}/min
Percent of predicted max HR: 91 %
RPE: 17
Stage 1 DBP: 90 mmHg
Stage 1 Grade: 0 %
Stage 1 HR: 77 {beats}/min
Stage 1 SBP: 138 mmHg
Stage 1 Speed: 0 mph
Stage 2 HR: 82 {beats}/min
Stage 3 Grade: 0 %
Stage 3 HR: 81 {beats}/min
Stage 3 Speed: 1 mph
Stage 4 Grade: 10 %
Stage 4 HR: 104 {beats}/min
Stage 4 Speed: 1.7 mph
Stage 5 Grade: 12 %
Stage 6 DBP: 54 mmHg
Stage 6 Grade: 14 %
Stage 6 HR: 153 {beats}/min
Stage 6 SBP: 113 mmHg
Stage 6 Speed: 3.4 mph
Stage 7 DBP: 60 mmHg
Stage 7 Grade: 0 %
Stage 7 HR: 115 {beats}/min
Stage 7 Speed: 0 mph
Stage 8 DBP: 77 mmHg
Stage 8 SBP: 140 mmHg

## 2015-01-02 ENCOUNTER — Ambulatory Visit: Payer: BC Managed Care – PPO | Admitting: Internal Medicine

## 2015-01-25 ENCOUNTER — Telehealth: Payer: Self-pay | Admitting: Internal Medicine

## 2015-01-25 MED ORDER — IRBESARTAN-HYDROCHLOROTHIAZIDE 150-12.5 MG PO TABS: 1.0000 | ORAL_TABLET | Freq: Every day | ORAL | Status: DC

## 2015-01-25 NOTE — Telephone Encounter (Signed)
Patient is needing a refill for irbesartan-hydrochlorothiazide (AVALIDE) 150-12.5 MG per tablet [[id number]. She only has 2 pills left, so if she needs to come in for an OV, can he prescribe 1 month and then we can get her an appointment in a couple weeks after Dr. Jenny Reichmann gets back.

## 2015-01-25 NOTE — Telephone Encounter (Signed)
Please note the encounter below

## 2015-01-25 NOTE — Addendum Note (Signed)
Addended by: Lyman Bishop on: 01/25/2015 01:01 PM   Modules accepted: Orders

## 2015-01-25 NOTE — Telephone Encounter (Signed)
Pharmacy is [facility] on [address].  

## 2015-04-13 ENCOUNTER — Emergency Department (HOSPITAL_COMMUNITY)
Admission: EM | Admit: 2015-04-13 | Discharge: 2015-04-13 | Disposition: A | Discharge status: Home / Self Care | Payer: BC Managed Care – PPO | Attending: Emergency Medicine | Admitting: Emergency Medicine

## 2015-04-13 ENCOUNTER — Encounter (HOSPITAL_COMMUNITY): Payer: Self-pay | Admitting: *Deleted

## 2015-04-13 DIAGNOSIS — K644 Residual hemorrhoidal skin tags: Secondary | ICD-10-CM | POA: Insufficient documentation

## 2015-04-13 DIAGNOSIS — E669 Obesity, unspecified: Secondary | ICD-10-CM | POA: Insufficient documentation

## 2015-04-13 DIAGNOSIS — Z79899 Other long term (current) drug therapy: Secondary | ICD-10-CM | POA: Diagnosis not present

## 2015-04-13 DIAGNOSIS — I10 Essential (primary) hypertension: Secondary | ICD-10-CM | POA: Diagnosis not present

## 2015-04-13 DIAGNOSIS — K649 Unspecified hemorrhoids: Secondary | ICD-10-CM

## 2015-04-13 DIAGNOSIS — K6289 Other specified diseases of anus and rectum: Secondary | ICD-10-CM | POA: Diagnosis present

## 2015-04-13 MED ORDER — PRAMOXINE HCL 1 % RE FOAM: 1.0000 "application " | Freq: Three times a day (TID) | RECTAL | Status: DC | PRN

## 2015-04-13 NOTE — Discharge Instructions (Signed)

## 2015-04-13 NOTE — ED Notes (Signed)
Pt c/o of rectal pain and sts she has "something growing there". Pt reports she noticed blood when wiping. Upon assessment noted pt has swollen external hemorrhoids.

## 2015-04-13 NOTE — ED Provider Notes (Signed)
CSN: [id number]     Arrival date & time 04/13/15  1357 History  This chart was scribed for non-physician practitioner Glendell Docker, PA-C working with Sherwood Gambler, MD by Hilda Lias, ED Scribe. This patient was seen in room WTR5/WTR5 and the patient's care was started at 3:10 PM.    Chief Complaint  Patient presents with  . Rectal Pain      The history is provided by the patient. No language interpreter was used.   HPI Comments: Susan Mejia is a 54 y.o. female who presents to the Emergency Department complaining of a constant, worsening hemmorrhoid with associated rectal pain, swelling, and bleeding that has been present since yesterday. Pt states she noticed the area was bleeding when she was wiping. Pt states she used preparation H to treat her symptoms with moderate relief as she says her pain decreased but the size of the painful area is the same. Pt denies having bowel movement problems, denies constipation or diarrhea. Denies hx of hemorrhoids.     Past Medical History  Diagnosis Date  . Hyperlipidemia     diet controlled  . Thyroid disease     hyperactive after pregancy, normal now, no meds  . Frequent headaches   . Essential hypertension, benign 11/17/2013  . Obesity    Past Surgical History  Procedure Laterality Date  . Cholecystectomy    . Tonsillectomy     Family History  Problem Relation Age of Onset  . Lung cancer Father   . Cancer Father     lung cancer  . Colon cancer Neg Hx   . Esophageal cancer Neg Hx   . Rectal cancer Neg Hx   . Stomach cancer Neg Hx   . Heart disease Mother   . Hypertension Mother   . Diabetes Other    Social History  Substance Use Topics  . Smoking status: Never Smoker   . Smokeless tobacco: Never Used  . Alcohol Use: No   OB History    No data available     Review of Systems  Gastrointestinal: Positive for anal bleeding and rectal pain. Negative for diarrhea and constipation.  All other systems reviewed and are  negative.     Allergies  Review of patient's allergies indicates no known allergies.  Home Medications   Prior to Admission medications   Medication Sig Start Date End Date Taking? Authorizing Provider  irbesartan-hydrochlorothiazide (AVALIDE) 150-12.5 MG per tablet Take 1 tablet by mouth daily. 01/25/15   Biagio Borg, MD  Melatonin 10 MG CAPS Take by mouth at bedtime.    Historical Provider, MD  zolpidem (AMBIEN) 10 MG tablet TAKE ONE TABLET BY MOUTH ONCE DAILY AT BEDTIME AS NEEDED FOR SLEEP 08/01/14   Biagio Borg, MD   BP 135/81 mmHg  Pulse 64  Temp(Src) 98.1 F (36.7 C) (Oral)  Resp 18  SpO2 96% Physical Exam  Constitutional: She is oriented to person, place, and time. She appears well-developed and well-nourished.  HENT:  Head: Atraumatic.  Cardiovascular: Normal rate and regular rhythm.   Pulmonary/Chest: Effort normal and breath sounds normal.  Genitourinary:  External hemorrhoid noted  Musculoskeletal: Normal range of motion.  Neurological: She is alert and oriented to person, place, and time.  Skin: Skin is warm and dry.  Psychiatric: She has a normal mood and affect.  Nursing note and vitals reviewed.   ED Course  Procedures (including critical care time)  DIAGNOSTIC STUDIES: Oxygen Saturation is 96% on room air, normal by  my interpretation.    COORDINATION OF CARE: 3:15 PM Discussed treatment plan with pt at bedside and pt agreed to plan.   Labs Review Labs Reviewed - No data to display  Imaging Review No results found. I have personally reviewed and evaluated these images and lab results as part of my medical decision-making.   EKG Interpretation None      MDM   Final diagnoses:  Hemorrhoids, unspecified hemorrhoid type    Pt given proctofoam and discussed symptomatic treatment at home  I personally performed the services described in this documentation, which was scribed in my presence. The recorded information has been reviewed and is  accurate.   Glendell Docker, NP 04/13/15 [place], MD 04/14/15 [phone]

## 2015-04-17 ENCOUNTER — Ambulatory Visit: Payer: BC Managed Care – PPO | Admitting: Internal Medicine

## 2015-05-03 ENCOUNTER — Other Ambulatory Visit (INDEPENDENT_AMBULATORY_CARE_PROVIDER_SITE_OTHER): Payer: BC Managed Care – PPO

## 2015-05-03 ENCOUNTER — Ambulatory Visit (INDEPENDENT_AMBULATORY_CARE_PROVIDER_SITE_OTHER): Payer: BC Managed Care – PPO | Admitting: Internal Medicine

## 2015-05-03 ENCOUNTER — Encounter: Payer: Self-pay | Admitting: Internal Medicine

## 2015-05-03 VITALS — BP 122/80 | HR 74 | Temp 98.4°F | Ht 61.0 in | Wt 181.0 lb

## 2015-05-03 DIAGNOSIS — Z0000 Encounter for general adult medical examination without abnormal findings: Secondary | ICD-10-CM

## 2015-05-03 DIAGNOSIS — R7989 Other specified abnormal findings of blood chemistry: Secondary | ICD-10-CM | POA: Diagnosis not present

## 2015-05-03 LAB — LIPID PANEL
Cholesterol: 240 mg/dL — ABNORMAL HIGH (ref 0–200)
HDL: 46.8 mg/dL (ref >39.00)
NONHDL: 193.56
Total CHOL/HDL Ratio: 5
Triglycerides: 232 mg/dL — ABNORMAL HIGH (ref 0.0–149.0)
VLDL: 46.4 mg/dL — AB (ref 0.0–40.0)

## 2015-05-03 LAB — BASIC METABOLIC PANEL
BUN: 21 mg/dL (ref 6–23)
CHLORIDE: 100 meq/L (ref 96–112)
CO2: 27 meq/L (ref 19–32)
Calcium: 10.2 mg/dL (ref 8.4–10.5)
Creatinine, Ser: 0.9 mg/dL (ref 0.40–1.20)
GFR: 84.27 mL/min (ref >60.00)
GLUCOSE: 92 mg/dL (ref 70–99)
POTASSIUM: 3.7 meq/L (ref 3.5–5.1)
SODIUM: 136 meq/L (ref 135–145)

## 2015-05-03 LAB — HEPATIC FUNCTION PANEL
ALT: 36 U/L — ABNORMAL HIGH (ref 0–35)
AST: 22 U/L (ref 0–37)
Albumin: 4.5 g/dL (ref 3.5–5.2)
Alkaline Phosphatase: 93 U/L (ref 39–117)
BILIRUBIN TOTAL: 0.3 mg/dL (ref 0.2–1.2)
Bilirubin, Direct: 0.1 mg/dL (ref 0.0–0.3)
Total Protein: 8.1 g/dL (ref 6.0–8.3)

## 2015-05-03 LAB — URINALYSIS, ROUTINE W REFLEX MICROSCOPIC
Bilirubin Urine: NEGATIVE
HGB URINE DIPSTICK: NEGATIVE
KETONES UR: NEGATIVE
Nitrite: NEGATIVE
SPECIFIC GRAVITY, URINE: 1.015 (ref 1.000–1.030)
Total Protein, Urine: NEGATIVE
Urine Glucose: NEGATIVE
Urobilinogen, UA: 0.2 (ref 0.0–1.0)
pH: 5.5 (ref 5.0–8.0)

## 2015-05-03 LAB — CBC WITH DIFFERENTIAL/PLATELET
Basophils Absolute: 0.1 10*3/uL (ref 0.0–0.1)
Basophils Relative: 0.7 % (ref 0.0–3.0)
EOS PCT: 0.7 % (ref 0.0–5.0)
Eosinophils Absolute: 0.1 10*3/uL (ref 0.0–0.7)
HCT: 42.4 % (ref 36.0–46.0)
Hemoglobin: 13.9 g/dL (ref 12.0–15.0)
LYMPHS ABS: 3.9 10*3/uL (ref 0.7–4.0)
Lymphocytes Relative: 35.9 % (ref 12.0–46.0)
MCHC: 32.8 g/dL (ref 30.0–36.0)
MCV: 86.4 fl (ref 78.0–100.0)
MONOS PCT: 8.9 % (ref 3.0–12.0)
Monocytes Absolute: 1 10*3/uL (ref 0.1–1.0)
NEUTROS ABS: 5.9 10*3/uL (ref 1.4–7.7)
NEUTROS PCT: 53.8 % (ref 43.0–77.0)
PLATELETS: 267 10*3/uL (ref 150.0–400.0)
RBC: 4.91 Mil/uL (ref 3.87–5.11)
RDW: 14.9 % (ref 11.5–15.5)
WBC: 11 10*3/uL — ABNORMAL HIGH (ref 4.0–10.5)

## 2015-05-03 LAB — LDL CHOLESTEROL, DIRECT: LDL DIRECT: 158 mg/dL

## 2015-05-03 LAB — TSH: TSH: 0.92 u[IU]/mL (ref 0.35–4.50)

## 2015-05-03 MED ORDER — IRBESARTAN-HYDROCHLOROTHIAZIDE 150-12.5 MG PO TABS: 1.0000 | ORAL_TABLET | Freq: Every day | ORAL | Status: DC

## 2015-05-03 NOTE — Assessment & Plan Note (Signed)

## 2015-05-03 NOTE — Patient Instructions (Signed)

## 2015-05-03 NOTE — Progress Notes (Signed)
Pre visit review using our clinic review tool, if applicable. No additional management support is needed unless otherwise documented below in the visit note. 

## 2015-05-03 NOTE — Progress Notes (Signed)
   Subjective:    Patient ID: Susan Mejia, female    DOB: [birth year], 54 y.o.   MRN: [id number]  HPI Here for wellness and f/u;  Overall doing ok;  Pt denies Chest pain, worsening SOB, DOE, wheezing, orthopnea, PND, worsening LE edema, palpitations, dizziness or syncope.  Pt denies neurological change such as new headache, facial or extremity weakness.  Pt denies polydipsia, polyuria, or low sugar symptoms. Pt states overall good compliance with treatment and medications, good tolerability, and has been trying to follow appropriate diet.  Pt denies worsening depressive symptoms, suicidal ideation or panic. No fever, night sweats, wt loss, loss of appetite, or other constitutional symptoms.  Pt states good ability with ADL's, has low fall risk, home safety reviewed and adequate, no other significant changes in hearing or vision, and only occasionally active with exercise.  BP Readings from Last 3 Encounters:  05/03/15 122/80  04/13/15 135/81  09/20/14 100/70   Wt Readings from Last 3 Encounters:  05/03/15 181 lb (82.101 kg)  09/20/14 182 lb 8 oz (82.781 kg)  08/22/14 181 lb (82.101 kg)     Review of Systems Constitutional: Negative for increased diaphoresis, other activity, appetite or siginficant weight change other than noted HENT: Negative for worsening hearing loss, ear pain, facial swelling, mouth sores and neck stiffness.   Eyes: Negative for other worsening pain, redness or visual disturbance.  Respiratory: Negative for shortness of breath and wheezing  Cardiovascular: Negative for chest pain and palpitations.  Gastrointestinal: Negative for diarrhea, blood in stool, abdominal distention or other pain Genitourinary: Negative for hematuria, flank pain or change in urine volume.  Musculoskeletal: Negative for myalgias or other joint complaints.  Skin: Negative for color change and wound or drainage.  Neurological: Negative for syncope and numbness. other than noted Hematological:  Negative for adenopathy. or other swelling Psychiatric/Behavioral: Negative for hallucinations, SI, self-injury, decreased concentration or other worsening agitation.      Objective:   Physical Exam BP 122/80 mmHg  Pulse 74  Temp(Src) 98.4 F (36.9 C) (Oral)  Ht 5\' 1"  (1.549 m)  Wt 181 lb (82.101 kg)  BMI 34.22 kg/m2  SpO2 99% VS noted,  Constitutional: Pt is oriented to person, place, and time. Appears well-developed and well-nourished, in no significant distress Head: Normocephalic and atraumatic.  Right Ear: External ear normal.  Left Ear: External ear normal.  Nose: Nose normal.  Mouth/Throat: Oropharynx is clear and moist.  Eyes: Conjunctivae and EOM are normal. Pupils are equal, round, and reactive to light.  Neck: Normal range of motion. Neck supple. No JVD present. No tracheal deviation present or significant neck LA or mass Cardiovascular: Normal rate, regular rhythm, normal heart sounds and intact distal pulses.   Pulmonary/Chest: Effort normal and breath sounds without rales or wheezing  Abdominal: Soft. Bowel sounds are normal. NT. No HSM  Musculoskeletal: Normal range of motion. Exhibits no edema.  Lymphadenopathy:  Has no cervical adenopathy.  Neurological: Pt is alert and oriented to person, place, and time. Pt has normal reflexes. No cranial nerve deficit. Motor grossly intact Skin: Skin is warm and dry. No rash noted.  Psychiatric:  Has normal mood and affect. Behavior is normal.      Assessment & Plan:

## 2015-05-04 ENCOUNTER — Encounter: Payer: Self-pay | Admitting: Internal Medicine

## 2015-05-04 ENCOUNTER — Telehealth: Payer: Self-pay | Admitting: Internal Medicine

## 2015-05-04 MED ORDER — ASPIRIN EC 81 MG PO TBEC: 81.0000 mg | DELAYED_RELEASE_TABLET | Freq: Every day | ORAL | Status: AC

## 2015-05-04 MED ORDER — ATORVASTATIN CALCIUM 10 MG PO TABS: 10.0000 mg | ORAL_TABLET | Freq: Every day | ORAL | Status: DC

## 2015-05-04 NOTE — Telephone Encounter (Signed)
Dahlia to also ask pt to start asa 81 mg per day, in addition to lipitor, to reduce heart and stroke risk in future

## 2015-05-04 NOTE — Telephone Encounter (Signed)
Pt informed and agreed

## 2015-05-31 ENCOUNTER — Encounter: Payer: Self-pay | Admitting: Internal Medicine

## 2015-05-31 ENCOUNTER — Ambulatory Visit (INDEPENDENT_AMBULATORY_CARE_PROVIDER_SITE_OTHER): Payer: BC Managed Care – PPO | Admitting: Internal Medicine

## 2015-05-31 VITALS — BP 132/82 | HR 77 | Temp 98.7°F | Resp 16

## 2015-05-31 DIAGNOSIS — I10 Essential (primary) hypertension: Secondary | ICD-10-CM

## 2015-05-31 DIAGNOSIS — J019 Acute sinusitis, unspecified: Secondary | ICD-10-CM | POA: Diagnosis not present

## 2015-05-31 MED ORDER — LEVOFLOXACIN 500 MG PO TABS: 500.0000 mg | ORAL_TABLET | Freq: Every day | ORAL | Status: DC

## 2015-05-31 MED ORDER — HYDROCODONE-HOMATROPINE 5-1.5 MG/5ML PO SYRP: 5.0000 mL | ORAL_SOLUTION | Freq: Four times a day (QID) | ORAL | Status: DC | PRN

## 2015-05-31 NOTE — Progress Notes (Signed)
   Subjective:    Patient ID: Susan Mejia, female    DOB: September 19, 1960, 54 y.o.   MRN: [id number]  HPI  Here with 2-3 days acute onset fever, facial pain, pressure, headache, general weakness and malaise, and greenish d/c, with mild ST and cough, but pt denies chest pain, wheezing, increased sob or doe, orthopnea, PND, increased LE swelling, palpitations, dizziness or syncope.  Pt denies new neurological symptoms such as new headache, or facial or extremity weakness or numbness   Pt denies polydipsia, polyuria Past Medical History  Diagnosis Date  . Hyperlipidemia     diet controlled  . Thyroid disease     hyperactive after pregancy, normal now, no meds  . Frequent headaches   . Essential hypertension, benign 11/17/2013  . Obesity    Past Surgical History  Procedure Laterality Date  . Cholecystectomy    . Tonsillectomy      reports that she has never smoked. She has never used smokeless tobacco. She reports that she does not drink alcohol or use illicit drugs. family history includes Cancer in her father; Diabetes in her other; Heart disease in her mother; Hypertension in her mother; Lung cancer in her father. There is no history of Colon cancer, Esophageal cancer, Rectal cancer, or Stomach cancer. No Known Allergies Current Outpatient Prescriptions on File Prior to Visit  Medication Sig Dispense Refill  . aspirin EC 81 MG tablet Take 1 tablet (81 mg total) by mouth daily. 90 tablet 11  . atorvastatin (LIPITOR) 10 MG tablet Take 1 tablet (10 mg total) by mouth daily. 90 tablet 3  . irbesartan-hydrochlorothiazide (AVALIDE) 150-12.5 MG tablet Take 1 tablet by mouth daily. 90 tablet 3  . Melatonin 10 MG CAPS Take by mouth at bedtime.    . pramoxine (PROCTOFOAM) 1 % foam Place 1 application rectally 3 (three) times daily as needed for itching. 15 g 0  . zolpidem (AMBIEN) 10 MG tablet TAKE ONE TABLET BY MOUTH ONCE DAILY AT BEDTIME AS NEEDED FOR SLEEP 30 tablet 5   No current  facility-administered medications on file prior to visit.   Review of Systems  All otherwise neg per pt     Objective:   Physical Exam BP 132/82 mmHg  Pulse 77  Temp(Src) 98.7 F (37.1 C) (Oral)  Resp 16  Wt   SpO2 97% VS noted, mild ill Constitutional: Pt appears in no significant distress HENT: Head: NCAT.  Right Ear: External ear normal.  Left Ear: External ear normal.  Bilat tm's with mild erythema.  Max sinus areas mild tender.  Pharynx with mild erythema, no exudate   Eyes: . Pupils are equal, round, and reactive to light. Conjunctivae and EOM are normal Neck: Normal range of motion. Neck supple.  Cardiovascular: Normal rate and regular rhythm.   Pulmonary/Chest: Effort normal and breath sounds without rales or wheezing.  Neurological: Pt is alert. Not confused , motor grossly intact Skin: Skin is warm. No rash, no LE edema Psychiatric: Pt behavior is normal. No agitation.     Assessment & Plan:

## 2015-05-31 NOTE — Progress Notes (Signed)
Pre visit review using our clinic review tool, if applicable. No additional management support is needed unless otherwise documented below in the visit note. 

## 2015-05-31 NOTE — Patient Instructions (Signed)
Please take all new medication as prescribed - the antibiotic, and cough medicine ° °Please continue all other medications as before, and refills have been done if requested. ° °Please have the pharmacy call with any other refills you may need. ° °Please continue your efforts at being more active, low cholesterol diet, and weight control. ° °Please keep your appointments with your specialists as you may have planned ° ° ° °

## 2015-06-02 DIAGNOSIS — J019 Acute sinusitis, unspecified: Secondary | ICD-10-CM | POA: Insufficient documentation

## 2015-06-02 NOTE — Assessment & Plan Note (Signed)
Mild to mod, for antibx course,  to f/u any worsening symptoms or concerns 

## 2015-06-02 NOTE — Assessment & Plan Note (Signed)
stable overall by history and exam, recent data reviewed with pt, and pt to continue medical treatment as before,  to f/u any worsening symptoms or concerns BP Readings from Last 3 Encounters:  05/31/15 132/82  05/03/15 122/80  04/13/15 135/81

## 2015-09-25 ENCOUNTER — Ambulatory Visit (INDEPENDENT_AMBULATORY_CARE_PROVIDER_SITE_OTHER): Payer: BC Managed Care – PPO | Admitting: Internal Medicine

## 2015-09-25 ENCOUNTER — Encounter: Payer: Self-pay | Admitting: Internal Medicine

## 2015-09-25 VITALS — BP 116/68 | HR 71 | Temp 98.5°F | Resp 20 | Ht 61.0 in | Wt 185.5 lb

## 2015-09-25 DIAGNOSIS — I10 Essential (primary) hypertension: Secondary | ICD-10-CM | POA: Diagnosis not present

## 2015-09-25 DIAGNOSIS — J029 Acute pharyngitis, unspecified: Secondary | ICD-10-CM | POA: Diagnosis not present

## 2015-09-25 DIAGNOSIS — E785 Hyperlipidemia, unspecified: Secondary | ICD-10-CM

## 2015-09-25 MED ORDER — AZITHROMYCIN 250 MG PO TABS: ORAL_TABLET | ORAL | Status: DC

## 2015-09-25 NOTE — Progress Notes (Signed)
Pre visit review using our clinic review tool, if applicable. No additional management support is needed unless otherwise documented below in the visit note. 

## 2015-09-25 NOTE — Assessment & Plan Note (Signed)
Mild to mod, for antibx course,  to f/u any worsening symptoms or concerns 

## 2015-09-25 NOTE — Patient Instructions (Addendum)
Please take all new medication as prescribed - the antibiotic  Please continue all other medications as before, and refills have been done if requested.  Please have the pharmacy call with any other refills you may need.  Please continue your efforts at being more active, low cholesterol diet, and weight control.  You are otherwise up to date with prevention measures today.  Please keep your appointments with your specialists as you may have planned  You will be contacted regarding the referral for: Bariatric Surgury  Please return in 6 months (about oct 1), or sooner if needed, with Lab testing done 3-5 days before

## 2015-09-25 NOTE — Assessment & Plan Note (Signed)
stable overall by history and exam, recent data reviewed with pt, and pt to continue medical treatment as before,  to f/u any worsening symptoms or concerns BP Readings from Last 3 Encounters:  09/25/15 116/68  05/31/15 132/82  05/03/15 122/80

## 2015-09-25 NOTE — Progress Notes (Signed)
Subjective:    Patient ID: Susan Mejia, female    DOB: 01/31/1961, 55 y.o.   MRN: [id number]  HPI  Here to f/u; overall doing ok,  Pt denies chest pain, increasing sob or doe, wheezing, orthopnea, PND, increased LE swelling, palpitations, dizziness or syncope.  Pt denies new neurological symptoms such as new headache, or facial or extremity weakness or numbness.  Pt denies polydipsia, polyuria, or low sugar episode.   Pt denies new neurological symptoms such as new headache, or facial or extremity weakness or numbness.   Pt states overall good compliance with meds, mostly trying to follow appropriate diet, with wt overall stable,  but little exercise however.  Also with severe ST x 3 days gradually getting worse, with some swelling to left neck as well. No significant cough or very high fevers though has felt warm.  Cont's to try to follow lower chol diet, tolerating the lipitor well.  BP has been adequate at home, though just cant seem to lose wt despite mult efforts at different diets and trying to be more active Past Medical History  Diagnosis Date  . Hyperlipidemia     diet controlled  . Thyroid disease     hyperactive after pregancy, normal now, no meds  . Frequent headaches   . Essential hypertension, benign 11/17/2013  . Obesity    Past Surgical History  Procedure Laterality Date  . Cholecystectomy    . Tonsillectomy      reports that she has never smoked. She has never used smokeless tobacco. She reports that she does not drink alcohol or use illicit drugs. family history includes Cancer in her father; Diabetes in her other; Heart disease in her mother; Hypertension in her mother; Lung cancer in her father. There is no history of Colon cancer, Esophageal cancer, Rectal cancer, or Stomach cancer. No Known Allergies  Current Outpatient Prescriptions on File Prior to Visit  Medication Sig Dispense Refill  . aspirin EC 81 MG tablet Take 1 tablet (81 mg total) by mouth daily. 90  tablet 11  . atorvastatin (LIPITOR) 10 MG tablet Take 1 tablet (10 mg total) by mouth daily. 90 tablet 3  . irbesartan-hydrochlorothiazide (AVALIDE) 150-12.5 MG tablet Take 1 tablet by mouth daily. 90 tablet 3  . levofloxacin (LEVAQUIN) 500 MG tablet Take 1 tablet (500 mg total) by mouth daily. 10 tablet 0  . Melatonin 10 MG CAPS Take by mouth at bedtime.    . pramoxine (PROCTOFOAM) 1 % foam Place 1 application rectally 3 (three) times daily as needed for itching. 15 g 0  . zolpidem (AMBIEN) 10 MG tablet TAKE ONE TABLET BY MOUTH ONCE DAILY AT BEDTIME AS NEEDED FOR SLEEP 30 tablet 5   No current facility-administered medications on file prior to visit.   Review of Systems  Constitutional: Negative for unusual diaphoresis or night sweats HENT: Negative for ringing in ear or discharge Eyes: Negative for double vision or worsening visual disturbance.  Respiratory: Negative for choking and stridor.   Gastrointestinal: Negative for vomiting or other signifcant bowel change Genitourinary: Negative for hematuria or change in urine volume.  Musculoskeletal: Negative for other MSK pain or swelling Skin: Negative for color change and worsening wound.  Neurological: Negative for tremors and numbness other than noted  Psychiatric/Behavioral: Negative for decreased concentration or agitation other than above       Objective:   Physical Exam BP 116/68 mmHg  Pulse 71  Temp(Src) 98.5 F (36.9 C) (Oral)  Resp 20  Ht 5\' 1"  (1.549 m)  Wt 185 lb 8 oz (84.142 kg)  BMI 35.07 kg/m2  SpO2 97% VS noted, mild ill Constitutional: Pt appears in no significant distress HENT: Head: NCAT.  Right Ear: External ear normal.  Left Ear: External ear normal.  Bilat tm's with mild erythema.  Max sinus areas mild tender.  Pharynx with severe erythema, no exudate, but has large left submandibular tender LA noted as well, mild on right Eyes: . Pupils are equal, round, and reactive to light. Conjunctivae and EOM are  normal Neck: Normal range of motion. Neck supple.  Cardiovascular: Normal rate and regular rhythm.   Pulmonary/Chest: Effort normal and breath sounds without rales or wheezing.  Abd:  Soft, NT, ND, + BS, no organomegaly Neurological: Pt is alert. Not confused , motor grossly intact Skin: Skin is warm. No Mejia, no LE edema Psychiatric: Pt behavior is normal. No agitation.     Assessment & Plan:

## 2015-09-25 NOTE — Assessment & Plan Note (Signed)
stable overall by history and exam, recent data reviewed with pt, and pt to continue medical treatment as before,  to f/u any worsening symptoms or concerns Lab Results  Component Value Date   LDLCALC 147* 09/15/2014   Declines f/u lab today, but to cont the lipitor, f/u next visit

## 2015-09-25 NOTE — Assessment & Plan Note (Addendum)
[facility] for referral Gen Surgury/Bariatric surgury due to elev BMI persistent and 2 comorbids - HTN, and HLD,  to f/u any worsening symptoms or concerns

## 2015-10-04 ENCOUNTER — Encounter: Payer: Self-pay | Admitting: Internal Medicine

## 2015-10-08 ENCOUNTER — Telehealth: Payer: Self-pay | Admitting: Internal Medicine

## 2015-10-08 NOTE — Telephone Encounter (Signed)
Wanted to make sure Dr. Jenny Reichmann received fax for surgery clearance for bariatric surgery along with a request for a letter.

## 2015-10-11 NOTE — Telephone Encounter (Signed)
Patient states she already has appointment for consult on 4/6 and needs to know if this will be completed.

## 2015-10-11 NOTE — Telephone Encounter (Signed)
Patient has called back in regards  °

## 2015-10-12 NOTE — Telephone Encounter (Signed)
Patient called back

## 2015-10-26 ENCOUNTER — Other Ambulatory Visit (HOSPITAL_COMMUNITY): Payer: Self-pay | Admitting: General Surgery

## 2015-11-09 ENCOUNTER — Ambulatory Visit (HOSPITAL_COMMUNITY)
Admission: RE | Admit: 2015-11-09 | Discharge: 2015-11-09 | Disposition: A | Discharge status: Home / Self Care | Payer: BC Managed Care – PPO | Source: Ambulatory Visit | Attending: General Surgery | Admitting: General Surgery

## 2015-11-09 ENCOUNTER — Other Ambulatory Visit: Payer: Self-pay

## 2015-11-09 DIAGNOSIS — Z01818 Encounter for other preprocedural examination: Secondary | ICD-10-CM | POA: Insufficient documentation

## 2015-11-09 DIAGNOSIS — K449 Diaphragmatic hernia without obstruction or gangrene: Secondary | ICD-10-CM | POA: Insufficient documentation

## 2015-11-09 DIAGNOSIS — K219 Gastro-esophageal reflux disease without esophagitis: Secondary | ICD-10-CM | POA: Insufficient documentation

## 2015-11-13 ENCOUNTER — Encounter: Payer: BC Managed Care – PPO | Attending: General Surgery | Admitting: Dietician

## 2015-11-13 ENCOUNTER — Encounter: Payer: Self-pay | Admitting: Dietician

## 2015-11-13 VITALS — Ht 60.0 in | Wt 190.4 lb

## 2015-11-13 DIAGNOSIS — E669 Obesity, unspecified: Secondary | ICD-10-CM

## 2015-11-13 NOTE — Progress Notes (Signed)
  Pre-Op Assessment Visit:  Pre-Operative Sleeve Gastrectomy Surgery  Medical Nutrition Therapy:  Appt start time: 0930   End time:  1005.  Patient was seen on 11/13/2015 for Pre-Operative Nutrition Assessment. Assessment and letter of approval faxed to [facility] Surgery Bariatric Surgery Program coordinator on 11/13/2015.   Preferred Learning Style:   No preference indicated   Learning Readiness:   Ready  Handouts given during visit include:  Pre-Op Goals Bariatric Surgery Protein Shakes   During the appointment today the following Pre-Op Goals were reviewed with the patient: Maintain or lose weight as instructed by your surgeon Make healthy food choices Begin to limit portion sizes Limited concentrated sugars and fried foods Keep fat/sugar in the single digits per serving on   food labels Practice CHEWING your food  (aim for 30 chews per bite or until applesauce consistency) Practice not drinking 15 minutes before, during, and 30 minutes after each meal/snack Avoid all carbonated beverages  Avoid/limit caffeinated beverages  Avoid all sugar-sweetened beverages Consume 3 meals per day; eat every 3-5 hours Make a list of non-food related activities Aim for 64-100 ounces of FLUID daily  Aim for at least 60-80 grams of PROTEIN daily Look for a liquid protein source that contain ?15 g protein and ?5 g carbohydrate  (ex: shakes, drinks, shots)  Patient-Centered Goals: Come off of high blood pressure and cholesterol medications and have more energy 10 confidence/10 importance   Demonstrated degree of understanding via:  Teach Back  Teaching Method Utilized:  Visual Auditory Hands on  Barriers to learning/adherence to lifestyle change: none  [facility] to [facility] to enroll in Pre-Op and Post-Op Nutrition Education when surgery date is scheduled.

## 2015-11-13 NOTE — Patient Instructions (Signed)
Follow Pre-Op Goals Try Protein Shakes Call [facility] at [phone] when surgery is scheduled to enroll in Pre-Op Class  Things to remember:  Please always be honest with us. We want to support you!  If you have any questions or concerns in between appointments, please call or email Liz, Leslie, or Laurie.  The diet after surgery will be high protein and low in carbohydrate.  Vitamins and calcium need to be taken for the rest of your life.  Feel free to include support people in any classes or appointments.   Supplement recommendations:  Complete" Multivitamin: Sleeve Gastrectomy and RYGB patients take a double dose of MVI. LAGB patients take single dose as it is written on the package. Vitamin must be liquid or chewable but not gummy. Examples of these include Flintstones Complete and Centrum Complete. If the vitamin is bariatric-specific, take 1 dose as it is already formulated for bariatric surgery patients. Examples of these are Bariatric Advantage, Celebrate, and Wellesse. These can be found at the [facility] and/or online.     Calcium citrate: 1500 mg/day of Calcium citrate (also chewable or liquid) is recommended for all procedures. The body is only able to absorb 500-600 mg of Calcium at one time so 3 daily doses of 500 mg are recommended. Calcium doses must be taken a minimum of 2 hours apart. Additionally, Calcium must be taken 2 hours apart from iron-containing MVI. Examples of brands include Celebrate, Bariatric Advantage, and Wellesse. These brands must be purchased online or at the [facility]. Citracal Petites is the only Calcium citrate supplement found in general grocery stores and pharmacies. This is in tablet form and may be recommended for patients who do not tolerate chewable Calcium.  Continued or added Vitamin D supplementation based on individual needs.    Vitamin B12: 300-500 mcg/day for Sleeve Gastrectomy and RYGB. Optional for  LAGB patients as stomach remains fully intact. Must be taken intramuscularly, sublingually, or inhaled nasally. Oral route is not recommended. 

## 2016-08-08 ENCOUNTER — Other Ambulatory Visit (INDEPENDENT_AMBULATORY_CARE_PROVIDER_SITE_OTHER): Payer: BC Managed Care – PPO

## 2016-08-08 ENCOUNTER — Encounter: Payer: Self-pay | Admitting: Internal Medicine

## 2016-08-08 ENCOUNTER — Ambulatory Visit (INDEPENDENT_AMBULATORY_CARE_PROVIDER_SITE_OTHER): Payer: BC Managed Care – PPO | Admitting: Internal Medicine

## 2016-08-08 VITALS — BP 136/80 | HR 60 | Resp 20 | Wt 161.0 lb

## 2016-08-08 DIAGNOSIS — Z1159 Encounter for screening for other viral diseases: Secondary | ICD-10-CM | POA: Diagnosis not present

## 2016-08-08 DIAGNOSIS — I10 Essential (primary) hypertension: Secondary | ICD-10-CM | POA: Diagnosis not present

## 2016-08-08 DIAGNOSIS — K219 Gastro-esophageal reflux disease without esophagitis: Secondary | ICD-10-CM

## 2016-08-08 DIAGNOSIS — E785 Hyperlipidemia, unspecified: Secondary | ICD-10-CM | POA: Diagnosis not present

## 2016-08-08 DIAGNOSIS — Z0000 Encounter for general adult medical examination without abnormal findings: Secondary | ICD-10-CM | POA: Diagnosis not present

## 2016-08-08 LAB — CBC WITH DIFFERENTIAL/PLATELET
BASOS ABS: 0 10*3/uL (ref 0.0–0.1)
Basophils Relative: 0.3 % (ref 0.0–3.0)
Eosinophils Absolute: 0 10*3/uL (ref 0.0–0.7)
Eosinophils Relative: 0.4 % (ref 0.0–5.0)
HCT: 38.8 % (ref 36.0–46.0)
Hemoglobin: 13 g/dL (ref 12.0–15.0)
LYMPHS ABS: 4 10*3/uL (ref 0.7–4.0)
Lymphocytes Relative: 38.8 % (ref 12.0–46.0)
MCHC: 33.5 g/dL (ref 30.0–36.0)
MCV: 85.8 fl (ref 78.0–100.0)
MONO ABS: 0.8 10*3/uL (ref 0.1–1.0)
Monocytes Relative: 7.5 % (ref 3.0–12.0)
NEUTROS ABS: 5.5 10*3/uL (ref 1.4–7.7)
NEUTROS PCT: 53 % (ref 43.0–77.0)
PLATELETS: 259 10*3/uL (ref 150.0–400.0)
RBC: 4.53 Mil/uL (ref 3.87–5.11)
RDW: 14.4 % (ref 11.5–15.5)
WBC: 10.4 10*3/uL (ref 4.0–10.5)

## 2016-08-08 LAB — LIPID PANEL
CHOLESTEROL: 172 mg/dL (ref 0–200)
HDL: 39.4 mg/dL (ref >39.00)
LDL Cholesterol: 107 mg/dL — ABNORMAL HIGH (ref 0–99)
NonHDL: 132.79
Total CHOL/HDL Ratio: 4
Triglycerides: 128 mg/dL (ref 0.0–149.0)
VLDL: 25.6 mg/dL (ref 0.0–40.0)

## 2016-08-08 LAB — BASIC METABOLIC PANEL
BUN: 11 mg/dL (ref 6–23)
CHLORIDE: 104 meq/L (ref 96–112)
CO2: 27 meq/L (ref 19–32)
Calcium: 10.1 mg/dL (ref 8.4–10.5)
Creatinine, Ser: 0.88 mg/dL (ref 0.40–1.20)
GFR: 86.07 mL/min (ref >60.00)
GLUCOSE: 79 mg/dL (ref 70–99)
POTASSIUM: 4 meq/L (ref 3.5–5.1)
SODIUM: 140 meq/L (ref 135–145)

## 2016-08-08 LAB — HEPATIC FUNCTION PANEL
ALBUMIN: 4.5 g/dL (ref 3.5–5.2)
ALK PHOS: 79 U/L (ref 39–117)
ALT: 18 U/L (ref 0–35)
AST: 16 U/L (ref 0–37)
BILIRUBIN DIRECT: 0.1 mg/dL (ref 0.0–0.3)
TOTAL PROTEIN: 8.1 g/dL (ref 6.0–8.3)
Total Bilirubin: 0.3 mg/dL (ref 0.2–1.2)

## 2016-08-08 LAB — URINALYSIS, ROUTINE W REFLEX MICROSCOPIC
BILIRUBIN URINE: NEGATIVE
Hgb urine dipstick: NEGATIVE
Ketones, ur: NEGATIVE
LEUKOCYTES UA: NEGATIVE
Nitrite: NEGATIVE
PH: 5.5 (ref 5.0–8.0)
RBC / HPF: NONE SEEN (ref 0–?)
SPECIFIC GRAVITY, URINE: 1.01 (ref 1.000–1.030)
TOTAL PROTEIN, URINE-UPE24: NEGATIVE
Urine Glucose: NEGATIVE
Urobilinogen, UA: 0.2 (ref 0.0–1.0)

## 2016-08-08 LAB — TSH: TSH: 0.86 u[IU]/mL (ref 0.35–4.50)

## 2016-08-08 MED ORDER — PANTOPRAZOLE SODIUM 40 MG PO TBEC: 40.0000 mg | DELAYED_RELEASE_TABLET | Freq: Every day | ORAL | 3 refills | Status: DC

## 2016-08-08 NOTE — Progress Notes (Signed)
Pre visit review using our clinic review tool, if applicable. No additional management support is needed unless otherwise documented below in the visit note. 

## 2016-08-08 NOTE — Assessment & Plan Note (Signed)
Tolerating statin well, working on diet, to cont med and f/u labs today

## 2016-08-08 NOTE — Assessment & Plan Note (Signed)
stable overall by history and exam, recent data reviewed with pt, and pt to continue medical treatment as before,  to f/u any worsening symptoms or concerns BP Readings from Last 3 Encounters:  08/08/16 136/80  09/25/15 116/68  05/31/15 132/82

## 2016-08-08 NOTE — Assessment & Plan Note (Signed)
To add PPI,  to f/u any worsening symptoms or concerns

## 2016-08-08 NOTE — Progress Notes (Addendum)
Subjective:    Patient ID: Susan Mejia, female    DOB: [birth year], 56 y.o.   MRN: [id number]  HPI  Here for wellness and f/u;  Overall doing ok;  Pt denies Chest pain, worsening SOB, DOE, wheezing, orthopnea, PND, worsening LE edema, palpitations, dizziness or syncope.  Pt denies neurological change such as new headache, facial or extremity weakness.  Pt denies polydipsia, polyuria, or low sugar symptoms. Pt states overall good compliance with treatment and medications, good tolerability, and has been trying to follow appropriate diet.  Pt denies worsening depressive symptoms, suicidal ideation or panic. No fever, night sweats, wt loss, loss of appetite, or other constitutional symptoms.  Pt states good ability with ADL's, has low fall risk, home safety reviewed and adequate, no other significant changes in hearing or vision, and occasionally active with exercise. Has really been working on diet with significant wt loss after denies baratric surgury coverage per insurance.  No new complaints.  Tolerating meds well  No other new history except has had mild worsening reflux, abd pain, dysphagia, n/v, bowel change or blood.  Planning to retire in 29 mo after 30 yrs at [employer]. Wt Readings from Last 3 Encounters:  08/08/16 161 lb (73 kg)  11/13/15 190 lb 6.4 oz (86.4 kg)  09/25/15 185 lb 8 oz (84.1 kg)   Past Medical History:  Diagnosis Date  . Essential hypertension, benign 11/17/2013  . Frequent headaches   . Hyperlipidemia    diet controlled  . Obesity   . Thyroid disease    hyperactive after pregancy, normal now, no meds   Past Surgical History:  Procedure Laterality Date  . CHOLECYSTECTOMY    . TONSILLECTOMY      reports that she has never smoked. She has never used smokeless tobacco. She reports that she does not drink alcohol or use drugs. family history includes Cancer in her father; Diabetes in her other; Heart disease in her mother; Hypertension in her mother; Lung  cancer in her father. No Known Allergies Current Outpatient Prescriptions on File Prior to Visit  Medication Sig Dispense Refill  . aspirin EC 81 MG tablet Take 1 tablet (81 mg total) by mouth daily. 90 tablet 11  . atorvastatin (LIPITOR) 10 MG tablet Take 1 tablet (10 mg total) by mouth daily. 90 tablet 3  . Cyanocobalamin (VITAMIN B-12) 1000 MCG SUBL Place under the tongue.    . irbesartan-hydrochlorothiazide (AVALIDE) 150-12.5 MG tablet Take 1 tablet by mouth daily. 90 tablet 3  . Melatonin 10 MG CAPS Take by mouth at bedtime.    . pramoxine (PROCTOFOAM) 1 % foam Place 1 application rectally 3 (three) times daily as needed for itching. 15 g 0  . zolpidem (AMBIEN) 10 MG tablet TAKE ONE TABLET BY MOUTH ONCE DAILY AT BEDTIME AS NEEDED FOR SLEEP 30 tablet 5   No current facility-administered medications on file prior to visit.    Review of Systems Constitutional: Negative for increased diaphoresis, or other activity, appetite or siginficant weight change other than noted HENT: Negative for worsening hearing loss, ear pain, facial swelling, mouth sores and neck stiffness.   Eyes: Negative for other worsening pain, redness or visual disturbance.  Respiratory: Negative for choking or stridor Cardiovascular: Negative for other chest pain and palpitations.  Gastrointestinal: Negative for worsening diarrhea, blood in stool, or abdominal distention Genitourinary: Negative for hematuria, flank pain or change in urine volume.  Musculoskeletal: Negative for myalgias or other joint complaints.  Skin: Negative for  other color change and wound or drainage.  Neurological: Negative for syncope and numbness. other than noted Hematological: Negative for adenopathy. or other swelling Psychiatric/Behavioral: Negative for hallucinations, SI, self-injury, decreased concentration or other worsening agitation.  All other system neg per pt    Objective:   Physical Exam BP 136/80   Pulse 60   Resp 20   Wt  161 lb (73 kg)   SpO2 98%   BMI 31.44 kg/m  VS noted,  Constitutional: Pt is oriented to person, place, and time. Appears well-developed and well-nourished, in no significant distress Head: Normocephalic and atraumatic  Eyes: Conjunctivae and EOM are normal. Pupils are equal, round, and reactive to light Right Ear: External ear normal.  Left Ear: External ear normal Nose: Nose normal.  Mouth/Throat: Oropharynx is clear and moist  Neck: Normal range of motion. Neck supple. No JVD present. No tracheal deviation present or significant neck LA or mass Cardiovascular: Normal rate, regular rhythm, normal heart sounds and intact distal pulses.   Pulmonary/Chest: Effort normal and breath sounds without rales or wheezing  Abdominal: Soft. Bowel sounds are normal. NT. No HSM  Musculoskeletal: Normal range of motion. Exhibits no edema Lymphadenopathy: Has no cervical adenopathy.  Neurological: Pt is alert and oriented to person, place, and time. Pt has normal reflexes. No cranial nerve deficit. Motor grossly intact Skin: Skin is warm and dry. No rash noted or new ulcers Psychiatric:  Has normal mood and affect. Behavior is normal.  No other new exam findings    Assessment & Plan:

## 2016-08-08 NOTE — Patient Instructions (Addendum)

## 2016-08-08 NOTE — Assessment & Plan Note (Signed)

## 2016-08-09 LAB — HEPATITIS C ANTIBODY: HCV Ab: NEGATIVE

## 2016-08-12 ENCOUNTER — Encounter: Payer: Self-pay | Admitting: Internal Medicine

## 2016-08-22 ENCOUNTER — Telehealth: Payer: Self-pay

## 2016-08-22 MED ORDER — ATORVASTATIN CALCIUM 10 MG PO TABS: 10.0000 mg | ORAL_TABLET | Freq: Every day | ORAL | 3 refills | Status: DC

## 2016-08-22 MED ORDER — IRBESARTAN-HYDROCHLOROTHIAZIDE 150-12.5 MG PO TABS: 1.0000 | ORAL_TABLET | Freq: Every day | ORAL | 3 refills | Status: DC

## 2016-08-22 NOTE — Telephone Encounter (Signed)
Pt called and rq rx for BP and Cholesterol   Erx sent.

## 2016-09-18 IMAGING — CR DG UGI W/ KUB
10 of 13 series · 14 of 19 positions shown · non-contrast
Comparison: None.

CLINICAL DATA: Preop for bariatric surgery.

EXAM:
UPPER GI SERIES WITH KUB
TECHNIQUE: After obtaining a scout radiograph a routine upper GI series was
performed using thin barium
FLUOROSCOPY TIME:  Radiation Exposure Index (as provided by the
fluoroscopic device): 14.8 mGy
If the device does not provide the exposure index:
Fluoroscopy Time (in minutes and seconds):  1 minutes and 42 seconds
Number of Acquired Images:

[t abdomen supine]
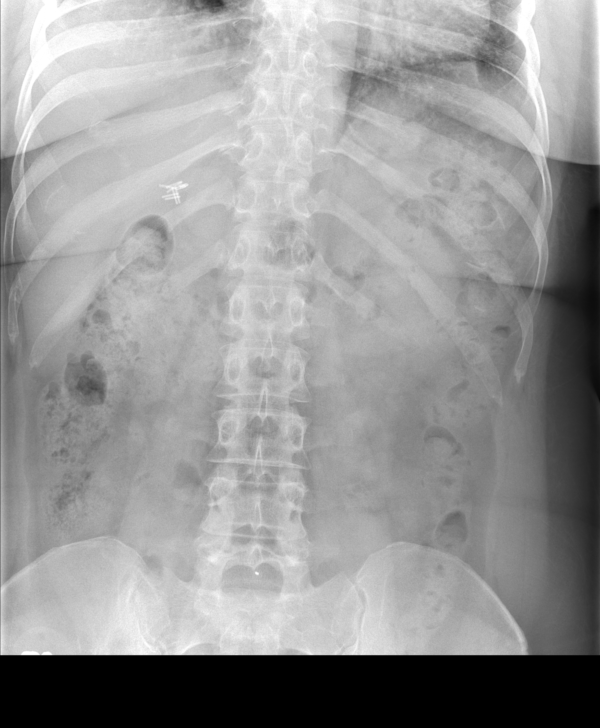

[cp_standard (1 of 8)]
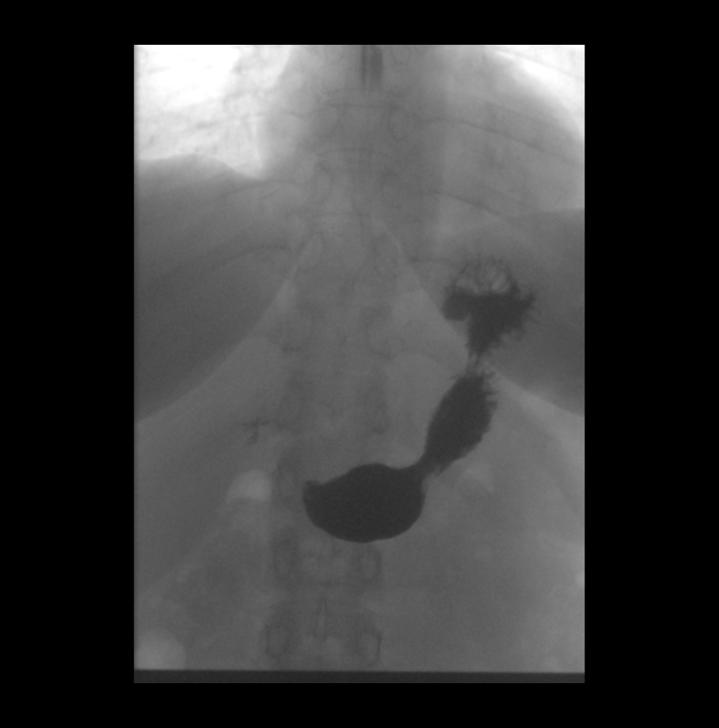

[Series 4: cp_standard · 0.51mm/px · 3 of 26 frames shown (2 of 8)]
[frame 4/26]
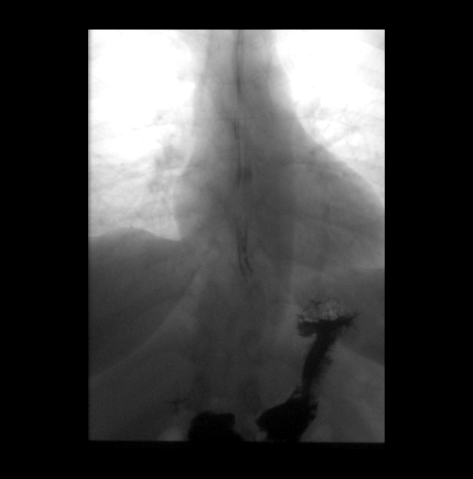
[frame 14/26]
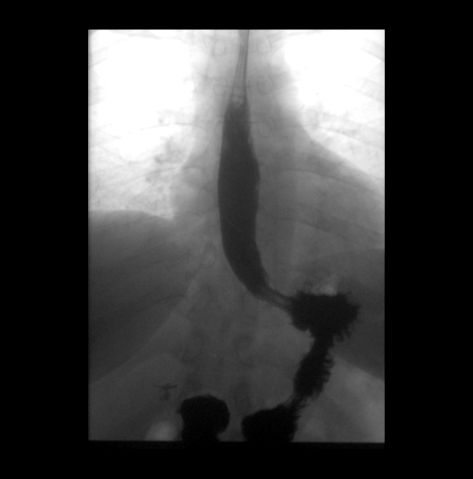
[frame 26/26]
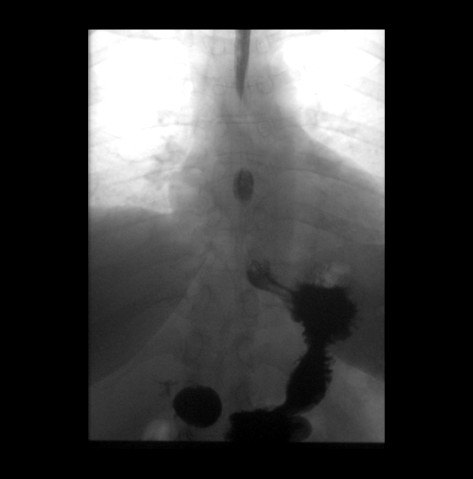

[cp_standard (3 of 8)]
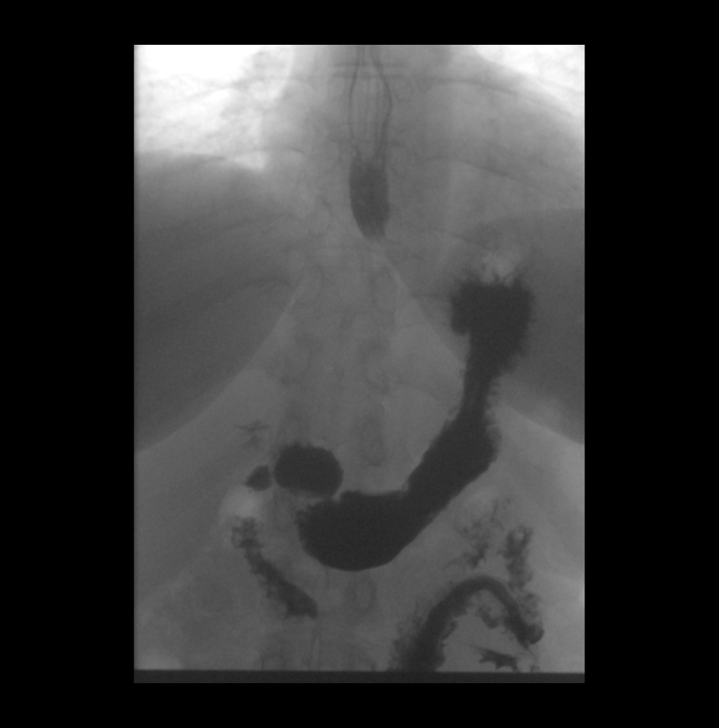

[Series 6: cp_standard · 0.54mm/px · 3 of 18 frames shown (4 of 8)]
[frame 3/18]
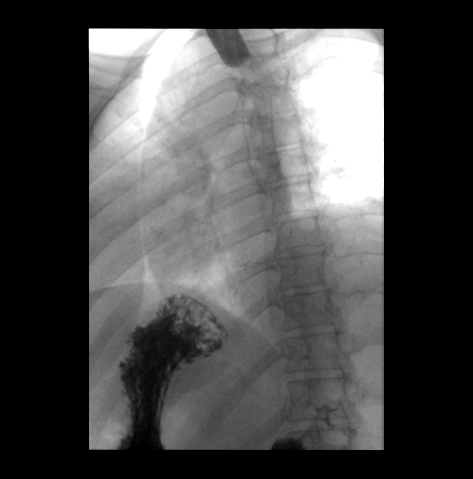
[frame 10/18]
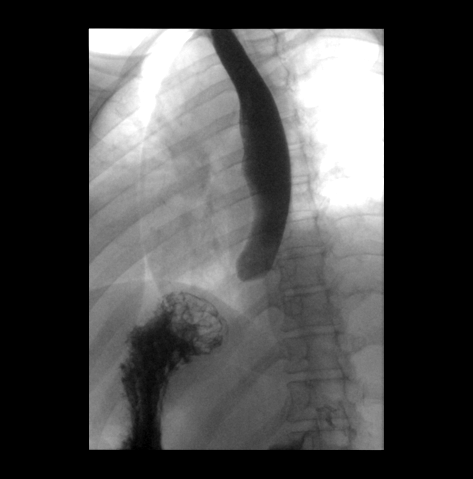
[frame 16/18]
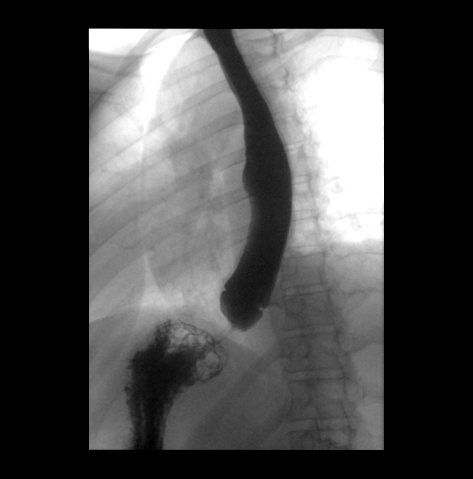

[cp_standard (5 of 8)]
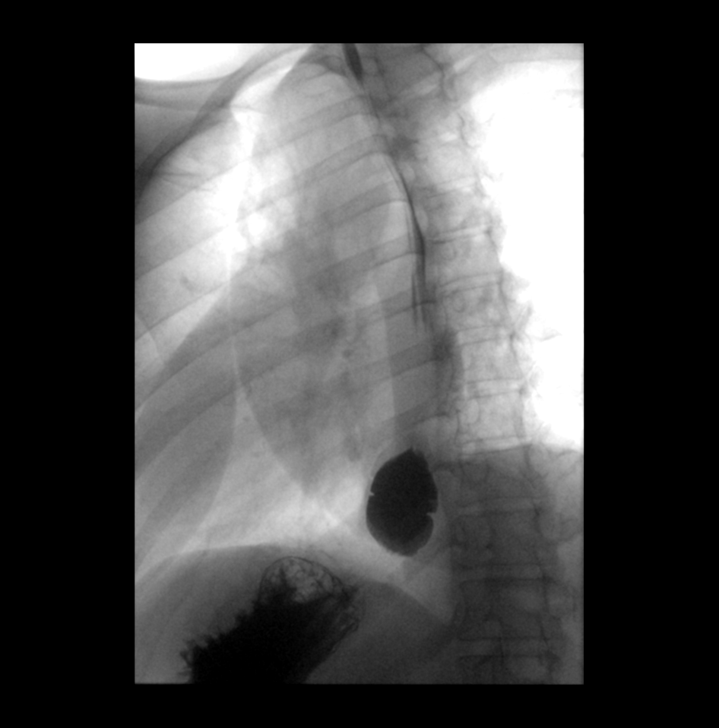

[fluoro_barium 2fps_bw]
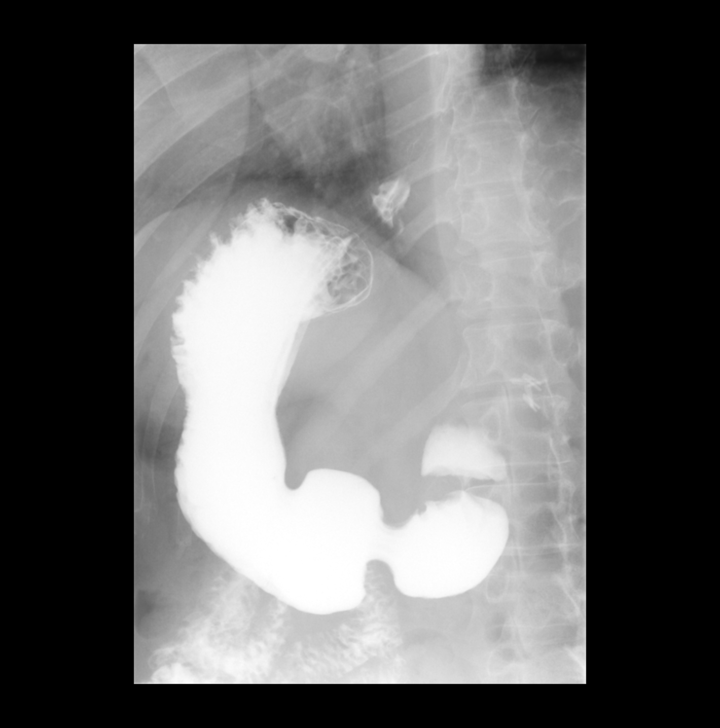

[cp_standard (6 of 8)]
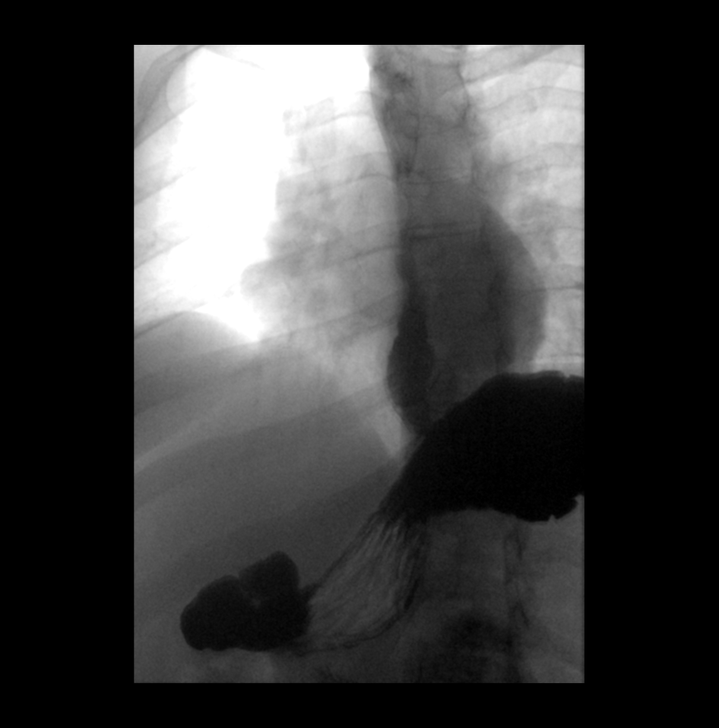

[cp_standard (7 of 8)]
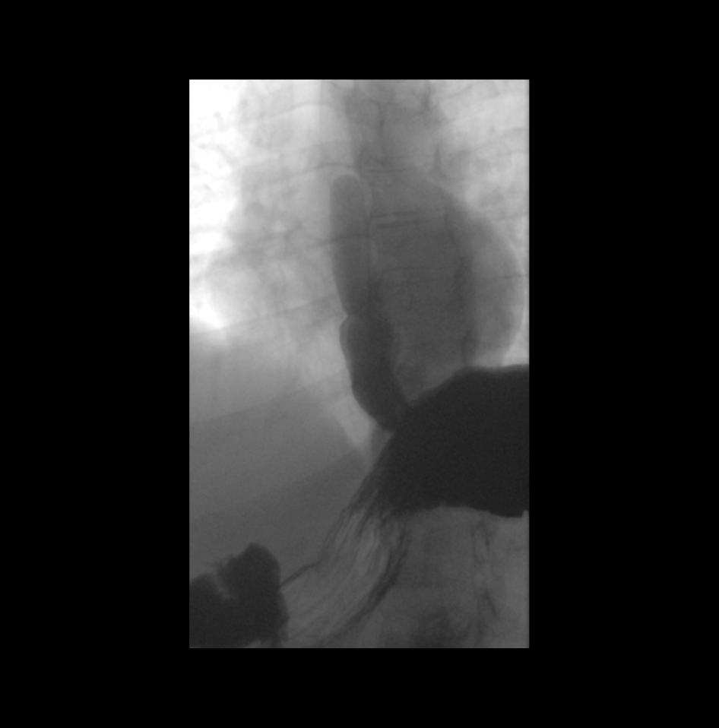

[cp_standard (8 of 8)]
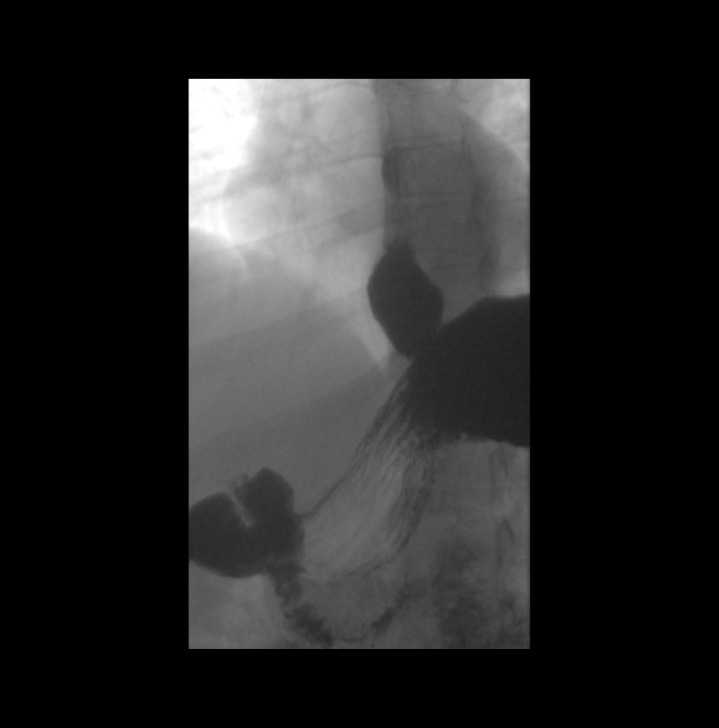

[14 of 19 positions shown; findings below may reference images not displayed]

FINDINGS: Normal esophageal motility. No intrinsic or extrinsic lesions of the
esophagus. Small sliding-type hiatal hernia with a widely patent
lower esophageal mucosal ring. Moderate inducible GE reflux is
noted.

The stomach, duodenal bulb and C-loop are normal.
IMPRESSION: Small sliding-type hiatal hernia and a widely patent lower
esophageal mucosal ring.

Moderate inducible GE reflux.

Normal stomach and duodenum.

## 2017-11-27 ENCOUNTER — Other Ambulatory Visit: Payer: Self-pay | Admitting: Internal Medicine

## 2018-01-14 ENCOUNTER — Telehealth: Payer: Self-pay | Admitting: Internal Medicine

## 2018-01-14 NOTE — Telephone Encounter (Signed)
Copied from [place] [id number]. Topic: Quick Communication - Rx Refill/Question >> Jan 14, 2018  3:20 PM Burchel, Abbi R wrote: Medication: atorvastatin (LIPITOR) 10 MG tablet  Has the patient contacted their pharmacy? Yes.    Preferred Pharmacy (with phone number or street name): [place], [place] - [address]  [phone] (Phone) [phone] (Fax)  Pt is completely out of this medication.  Pt advised that Rx refills take 3 business days.

## 2018-01-15 NOTE — Telephone Encounter (Signed)
Telephone call from patient  Requesting  Refill on Lipitor  10mg .      LOV 08/08/16 with Cathlean Cower MD   Last refill:  08/22/16  Pharmacy :  [facility]

## 2018-01-16 NOTE — Telephone Encounter (Signed)
Patient has not been seen since January 2018. Please schedule an appt for her and I will send it a 30 day supply once she has been scheduled. Thanks!

## 2018-01-19 MED ORDER — ATORVASTATIN CALCIUM 10 MG PO TABS: 10.0000 mg | ORAL_TABLET | Freq: Every day | ORAL | 0 refills | Status: DC

## 2018-01-19 NOTE — Telephone Encounter (Signed)
appt made

## 2018-01-19 NOTE — Addendum Note (Signed)
Addended by: Juliet Rude on: 01/19/2018 12:46 PM   Modules accepted: Orders

## 2018-01-20 ENCOUNTER — Encounter: Payer: Self-pay | Admitting: Internal Medicine

## 2018-01-20 ENCOUNTER — Ambulatory Visit (INDEPENDENT_AMBULATORY_CARE_PROVIDER_SITE_OTHER): Payer: BC Managed Care – PPO | Admitting: Internal Medicine

## 2018-01-20 ENCOUNTER — Other Ambulatory Visit (INDEPENDENT_AMBULATORY_CARE_PROVIDER_SITE_OTHER): Payer: BC Managed Care – PPO

## 2018-01-20 VITALS — BP 134/86 | HR 81 | Temp 98.6°F | Ht 60.0 in | Wt 174.0 lb

## 2018-01-20 DIAGNOSIS — Z0000 Encounter for general adult medical examination without abnormal findings: Secondary | ICD-10-CM

## 2018-01-20 DIAGNOSIS — Z114 Encounter for screening for human immunodeficiency virus [HIV]: Secondary | ICD-10-CM

## 2018-01-20 DIAGNOSIS — I10 Essential (primary) hypertension: Secondary | ICD-10-CM | POA: Diagnosis not present

## 2018-01-20 LAB — URINALYSIS, ROUTINE W REFLEX MICROSCOPIC
Bilirubin Urine: NEGATIVE
Hgb urine dipstick: NEGATIVE
KETONES UR: NEGATIVE
LEUKOCYTES UA: NEGATIVE
NITRITE: NEGATIVE
PH: 5.5 (ref 5.0–8.0)
RBC / HPF: NONE SEEN (ref 0–?)
SPECIFIC GRAVITY, URINE: 1.015 (ref 1.000–1.030)
Total Protein, Urine: NEGATIVE
UROBILINOGEN UA: 0.2 (ref 0.0–1.0)
Urine Glucose: NEGATIVE

## 2018-01-20 LAB — CBC WITH DIFFERENTIAL/PLATELET
BASOS ABS: 0 10*3/uL (ref 0.0–0.1)
Basophils Relative: 0.6 % (ref 0.0–3.0)
EOS PCT: 0.4 % (ref 0.0–5.0)
Eosinophils Absolute: 0 10*3/uL (ref 0.0–0.7)
HEMATOCRIT: 41.7 % (ref 36.0–46.0)
Hemoglobin: 13.7 g/dL (ref 12.0–15.0)
Lymphocytes Relative: 41 % (ref 12.0–46.0)
Lymphs Abs: 2.9 10*3/uL (ref 0.7–4.0)
MCHC: 32.9 g/dL (ref 30.0–36.0)
MCV: 86.8 fl (ref 78.0–100.0)
Monocytes Absolute: 0.4 10*3/uL (ref 0.1–1.0)
Monocytes Relative: 6 % (ref 3.0–12.0)
NEUTROS ABS: 3.6 10*3/uL (ref 1.4–7.7)
Neutrophils Relative %: 52 % (ref 43.0–77.0)
PLATELETS: 217 10*3/uL (ref 150.0–400.0)
RBC: 4.81 Mil/uL (ref 3.87–5.11)
RDW: 14.7 % (ref 11.5–15.5)
WBC: 7 10*3/uL (ref 4.0–10.5)

## 2018-01-20 LAB — BASIC METABOLIC PANEL
BUN: 11 mg/dL (ref 6–23)
CALCIUM: 10 mg/dL (ref 8.4–10.5)
CHLORIDE: 104 meq/L (ref 96–112)
CO2: 27 mEq/L (ref 19–32)
CREATININE: 0.74 mg/dL (ref 0.40–1.20)
GFR: 104.56 mL/min (ref >60.00)
Glucose, Bld: 98 mg/dL (ref 70–99)
Potassium: 4.2 mEq/L (ref 3.5–5.1)
SODIUM: 140 meq/L (ref 135–145)

## 2018-01-20 LAB — LIPID PANEL
Cholesterol: 171 mg/dL (ref 0–200)
HDL: 57.3 mg/dL (ref >39.00)
LDL Cholesterol: 98 mg/dL (ref 0–99)
NonHDL: 113.97
TRIGLYCERIDES: 79 mg/dL (ref 0.0–149.0)
Total CHOL/HDL Ratio: 3
VLDL: 15.8 mg/dL (ref 0.0–40.0)

## 2018-01-20 LAB — HEPATIC FUNCTION PANEL
ALBUMIN: 4.6 g/dL (ref 3.5–5.2)
ALK PHOS: 85 U/L (ref 39–117)
ALT: 16 U/L (ref 0–35)
AST: 15 U/L (ref 0–37)
Bilirubin, Direct: 0.1 mg/dL (ref 0.0–0.3)
TOTAL PROTEIN: 8 g/dL (ref 6.0–8.3)
Total Bilirubin: 0.4 mg/dL (ref 0.2–1.2)

## 2018-01-20 LAB — TSH: TSH: 0.82 u[IU]/mL (ref 0.35–4.50)

## 2018-01-20 MED ORDER — ATORVASTATIN CALCIUM 10 MG PO TABS: 10.0000 mg | ORAL_TABLET | Freq: Every day | ORAL | 3 refills | Status: DC

## 2018-01-20 MED ORDER — IRBESARTAN-HYDROCHLOROTHIAZIDE 150-12.5 MG PO TABS: 1.0000 | ORAL_TABLET | Freq: Every day | ORAL | 3 refills | Status: DC

## 2018-01-20 MED ORDER — PANTOPRAZOLE SODIUM 40 MG PO TBEC: 40.0000 mg | DELAYED_RELEASE_TABLET | Freq: Every day | ORAL | 3 refills | Status: DC | PRN

## 2018-01-20 MED ORDER — PANTOPRAZOLE SODIUM 40 MG PO TBEC: 40.0000 mg | DELAYED_RELEASE_TABLET | Freq: Every day | ORAL | 3 refills | Status: DC

## 2018-01-20 MED ORDER — TELMISARTAN-HCTZ 80-25 MG PO TABS: 0.5000 | ORAL_TABLET | Freq: Every day | ORAL | 3 refills | Status: DC

## 2018-01-20 MED ORDER — ATORVASTATIN CALCIUM 10 MG PO TABS: 10.0000 mg | ORAL_TABLET | Freq: Every day | ORAL | 0 refills | Status: DC

## 2018-01-20 NOTE — Patient Instructions (Addendum)
Ok to stop the avalide  Please take all new medication as prescribed - the micardis HCT if ok with the insurance  Please continue all other medications as before, and refills have been done if requested.  Please have the pharmacy call with any other refills you may need.  Please continue your efforts at being more active, low cholesterol diet, and weight control.  You are otherwise up to date with prevention measures today.  Please keep your appointments with your specialists as you may have planned  Please go to the LAB in the Basement (turn left off the elevator) for the tests to be done today  You will be contacted by phone if any changes need to be made immediately.  Otherwise, you will receive a letter about your results with an explanation, but please check with MyChart first.  Please remember to sign up for MyChart if you have not done so, as this will be important to you in the future with finding out test results, communicating by private email, and scheduling acute appointments online when needed.  Please return in 1 year for your yearly visit, or sooner if needed, with Lab testing done 3-5 days before

## 2018-01-20 NOTE — Assessment & Plan Note (Signed)

## 2018-01-20 NOTE — Progress Notes (Signed)
Subjective:    Patient ID: Susan Mejia, female    DOB: 1961-01-11, 57 y.o.   MRN: [id number]  HPI  Here for wellness and f/u;  Overall doing ok;  Pt denies Chest pain, worsening SOB, DOE, wheezing, orthopnea, PND, worsening LE edema, palpitations, dizziness or syncope.  Pt denies neurological change such as new headache, facial or extremity weakness.  Pt denies polydipsia, polyuria, or low sugar symptoms. Pt states overall good compliance with treatment and medications, good tolerability, and has been trying to follow appropriate diet.  Pt denies worsening depressive symptoms, suicidal ideation or panic. No fever, night sweats, wt loss, loss of appetite, or other constitutional symptoms.  Pt states good ability with ADL's, has low fall risk, home safety reviewed and adequate, no other significant changes in hearing or vision, and only occasionally active with exercise.  No new complaints  Wt Readings from Last 3 Encounters:  01/20/18 174 lb (78.9 kg)  08/08/16 161 lb (73 kg)  11/13/15 190 lb 6.4 oz (86.4 kg)   Past Medical History:  Diagnosis Date  . Essential hypertension, benign 11/17/2013  . Frequent headaches   . Hyperlipidemia    diet controlled  . Obesity   . Thyroid disease    hyperactive after pregancy, normal now, no meds   Past Surgical History:  Procedure Laterality Date  . CHOLECYSTECTOMY    . TONSILLECTOMY      reports that she has never smoked. She has never used smokeless tobacco. She reports that she does not drink alcohol or use drugs. family history includes Cancer in her father; Diabetes in her other; Heart disease in her mother; Hypertension in her mother; Lung cancer in her father. No Known Allergies Current Outpatient Medications on File Prior to Visit  Medication Sig Dispense Refill  . aspirin EC 81 MG tablet Take 1 tablet (81 mg total) by mouth daily. 90 tablet 11  . Cyanocobalamin (VITAMIN B-12) 1000 MCG SUBL Place under the tongue.     No current  facility-administered medications on file prior to visit.    Review of Systems Constitutional: Negative for other unusual diaphoresis, sweats, appetite or weight changes HENT: Negative for other worsening hearing loss, ear pain, facial swelling, mouth sores or neck stiffness.   Eyes: Negative for other worsening pain, redness or other visual disturbance.  Respiratory: Negative for other stridor or swelling Cardiovascular: Negative for other palpitations or other chest pain  Gastrointestinal: Negative for worsening diarrhea or loose stools, blood in stool, distention or other pain Genitourinary: Negative for hematuria, flank pain or other change in urine volume.  Musculoskeletal: Negative for myalgias or other joint swelling.  Skin: Negative for other color change, or other wound or worsening drainage.  Neurological: Negative for other syncope or numbness. Hematological: Negative for other adenopathy or swelling Psychiatric/Behavioral: Negative for hallucinations, other worsening agitation, SI, self-injury, or new decreased concentration All other system neg per pt    Objective:   Physical Exam BP 134/86   Pulse 81   Temp 98.6 F (37 C) (Oral)   Ht 5' (1.524 m)   Wt 174 lb (78.9 kg)   SpO2 97%   BMI 33.98 kg/m  VS noted,  Constitutional: Pt is oriented to person, place, and time. Appears well-developed and well-nourished, in no significant distress and comfortable Head: Normocephalic and atraumatic  Eyes: Conjunctivae and EOM are normal. Pupils are equal, round, and reactive to light Right Ear: External ear normal without discharge Left Ear: External ear normal without discharge  Nose: Nose without discharge or deformity Mouth/Throat: Oropharynx is without other ulcerations and moist  Neck: Normal range of motion. Neck supple. No JVD present. No tracheal deviation present or significant neck LA or mass Cardiovascular: Normal rate, regular rhythm, normal heart sounds and intact  distal pulses.   Pulmonary/Chest: WOB normal and breath sounds without rales or wheezing  Abdominal: Soft. Bowel sounds are normal. NT. No HSM  Musculoskeletal: Normal range of motion. Exhibits no edema Lymphadenopathy: Has no other cervical adenopathy.  Neurological: Pt is alert and oriented to person, place, and time. Pt has normal reflexes. No cranial nerve deficit. Motor grossly intact, Gait intact Skin: Skin is warm and dry. No rash noted or new ulcerations Psychiatric:  Has normal mood and affect. Behavior is normal without agitation No other exam findings Lab Results  Component Value Date   WBC 7.0 01/20/2018   HGB 13.7 01/20/2018   HCT 41.7 01/20/2018   PLT 217.0 01/20/2018   GLUCOSE 98 01/20/2018   CHOL 171 01/20/2018   TRIG 79.0 01/20/2018   HDL 57.30 01/20/2018   LDLDIRECT 158.0 05/03/2015   LDLCALC 98 01/20/2018   ALT 16 01/20/2018   AST 15 01/20/2018   NA 140 01/20/2018   K 4.2 01/20/2018   CL 104 01/20/2018   CREATININE 0.74 01/20/2018   BUN 11 01/20/2018   CO2 27 01/20/2018   TSH 0.82 01/20/2018  '    Assessment & Plan:

## 2018-01-20 NOTE — Assessment & Plan Note (Signed)
Ok to change the avalide to micardis HCT due to recall

## 2018-01-21 LAB — HIV ANTIBODY (ROUTINE TESTING W REFLEX): HIV 1&2 Ab, 4th Generation: NONREACTIVE

## 2018-04-04 ENCOUNTER — Emergency Department (HOSPITAL_COMMUNITY): Payer: BC Managed Care – PPO

## 2018-04-04 ENCOUNTER — Emergency Department (HOSPITAL_COMMUNITY)
Admission: EM | Admit: 2018-04-04 | Discharge: 2018-04-04 | Disposition: A | Discharge status: Home / Self Care | Payer: BC Managed Care – PPO | Attending: Emergency Medicine | Admitting: Emergency Medicine

## 2018-04-04 ENCOUNTER — Encounter (HOSPITAL_COMMUNITY): Payer: Self-pay

## 2018-04-04 ENCOUNTER — Other Ambulatory Visit: Payer: Self-pay

## 2018-04-04 DIAGNOSIS — R51 Headache: Secondary | ICD-10-CM | POA: Diagnosis not present

## 2018-04-04 DIAGNOSIS — R519 Headache, unspecified: Secondary | ICD-10-CM

## 2018-04-04 DIAGNOSIS — Z79899 Other long term (current) drug therapy: Secondary | ICD-10-CM | POA: Insufficient documentation

## 2018-04-04 DIAGNOSIS — I10 Essential (primary) hypertension: Secondary | ICD-10-CM | POA: Insufficient documentation

## 2018-04-04 DIAGNOSIS — Z7982 Long term (current) use of aspirin: Secondary | ICD-10-CM | POA: Diagnosis not present

## 2018-04-04 LAB — COMPREHENSIVE METABOLIC PANEL
ALK PHOS: 94 U/L (ref 38–126)
ALT: 31 U/L (ref 0–44)
ANION GAP: 13 (ref 5–15)
AST: 28 U/L (ref 15–41)
Albumin: 5.3 g/dL — ABNORMAL HIGH (ref 3.5–5.0)
BUN: 10 mg/dL (ref 6–20)
CALCIUM: 10.2 mg/dL (ref 8.9–10.3)
CHLORIDE: 107 mmol/L (ref 98–111)
CO2: 26 mmol/L (ref 22–32)
Creatinine, Ser: 0.75 mg/dL (ref 0.44–1.00)
GFR calc non Af Amer: >60 mL/min (ref >60)
Glucose, Bld: 84 mg/dL (ref 70–99)
Potassium: 3.9 mmol/L (ref 3.5–5.1)
Sodium: 146 mmol/L — ABNORMAL HIGH (ref 135–145)
Total Bilirubin: 0.6 mg/dL (ref 0.3–1.2)
Total Protein: 8.8 g/dL — ABNORMAL HIGH (ref 6.5–8.1)

## 2018-04-04 LAB — CBC WITH DIFFERENTIAL/PLATELET
BASOS PCT: 0 %
Basophils Absolute: 0 10*3/uL (ref 0.0–0.1)
Eosinophils Absolute: 0 10*3/uL (ref 0.0–0.7)
Eosinophils Relative: 0 %
HEMATOCRIT: 45.8 % (ref 36.0–46.0)
HEMOGLOBIN: 15 g/dL (ref 12.0–15.0)
Lymphocytes Relative: 40 %
Lymphs Abs: 3.3 10*3/uL (ref 0.7–4.0)
MCH: 29.1 pg (ref 26.0–34.0)
MCHC: 32.8 g/dL (ref 30.0–36.0)
MCV: 88.8 fL (ref 78.0–100.0)
Monocytes Absolute: 0.5 10*3/uL (ref 0.1–1.0)
Monocytes Relative: 7 %
NEUTROS ABS: 4.4 10*3/uL (ref 1.7–7.7)
NEUTROS PCT: 53 %
Platelets: 284 10*3/uL (ref 150–400)
RBC: 5.16 MIL/uL — AB (ref 3.87–5.11)
RDW: 14.8 % (ref 11.5–15.5)
WBC: 8.3 10*3/uL (ref 4.0–10.5)

## 2018-04-04 MED ORDER — LORAZEPAM 2 MG/ML IJ SOLN
1.0000 mg | Freq: Once | INTRAMUSCULAR | Status: AC
  Administered 2018-04-04: 1 mg via INTRAVENOUS
  Filled 2018-04-04: qty 1

## 2018-04-04 MED ORDER — FENTANYL CITRATE (PF) 100 MCG/2ML IJ SOLN
50.0000 ug | Freq: Once | INTRAMUSCULAR | Status: AC
  Administered 2018-04-04: 50 ug via INTRAVENOUS
  Filled 2018-04-04: qty 2

## 2018-04-04 NOTE — ED Triage Notes (Signed)
EMS reports from [place], [place] began to have severe headache, Hx of hypertension, non compliant, has not taken hypertensive medication x 4 days  BP 210/160 HR 86 Resp 20 Sp02 97 RA CBG 97

## 2018-04-04 NOTE — ED Notes (Signed)
Bed: WA23 Expected date:  Expected time:  Means of arrival:  Comments: EMS 

## 2018-04-04 NOTE — Discharge Instructions (Signed)
Please read instructions below. °You can take 600 mg of Advil/ibuprofen every 6 hours as needed for headache. °Schedule an appointment with your primary care provider to follow up on your headache and discuss preventative treatment. °Return to the ER for severely worsening headache, vision changes, fever, weakness or numbness, or new or concerning symptoms. ° °

## 2018-04-04 NOTE — ED Provider Notes (Signed)
[name] Provider Note   CSN: [id number] Arrival date & time: 04/04/18  1136     History   Chief Complaint Chief Complaint  Patient presents with  . Hypertension  . Headache    HPI Susan Mejia is a 57 y.o. female with past medical history of hypertension, migraine headache, GERD, presenting to the ED with complaint of acute onset of frontal headache that began to arrival.  She states headache onset was sudden and throbbing.  Reports associated photophobia and pain with moving her eyes.  Denies fever, vision changes, nausea, weakness or numbness, slurred speech or facial droop.  She admits to being noncompliant with her hypertensive medication which includes irbesartan-hydrochlorothiazide.  She states she has not taken the medication in a couple of weeks as she has had a "rough couple of weeks" due to her brother passing.  This is been causing her much anxiety.  Reports history of similar headaches in the past, though with more gradual onset, and gets frequent headaches.  The history is provided by the patient.    Past Medical History:  Diagnosis Date  . Essential hypertension, benign 11/17/2013  . Frequent headaches   . Hyperlipidemia    diet controlled  . Obesity   . Thyroid disease    hyperactive after pregancy, normal now, no meds    Patient Active Problem List   Diagnosis Date Noted  . GERD (gastroesophageal reflux disease) 08/08/2016  . Acute sinus infection 06/02/2015  . Migraine headache 04/28/2014  . Insomnia 12/30/2013  . Essential hypertension, benign 11/17/2013  . Preventative health care 11/17/2013  . Chest pain 11/17/2013  . UNSPECIFIED SINUSITIS 08/08/2009  . AMENORRHEA 11/22/2008  . VERTIGO 11/22/2008  . Hyperlipidemia 03/07/2007  . Morbid obesity ([place]) 03/07/2007  . DEPRESSION 03/07/2007  . ALLERGIC RHINITIS 03/07/2007    Past Surgical History:  Procedure Laterality Date  . CHOLECYSTECTOMY    .  TONSILLECTOMY       OB History   None      Home Medications    Prior to Admission medications   Medication Sig Start Date End Date Taking? Authorizing Provider  diphenhydramine-acetaminophen (TYLENOL PM) 25-500 MG TABS tablet Take 1 tablet by mouth at bedtime as needed (for sleep).   Yes [provider]  irbesartan-hydrochlorothiazide (AVALIDE) 150-12.5 MG tablet Take 1 tablet by mouth daily. 01/20/18  Yes [provider]  pantoprazole (PROTONIX) 40 MG tablet Take 1 tablet (40 mg total) by mouth daily as needed. Patient taking differently: Take 40 mg by mouth daily as needed (for acid reflux).  01/20/18  Yes Biagio Borg, MD  aspirin EC 81 MG tablet Take 1 tablet (81 mg total) by mouth daily. Patient not taking: Reported on 04/04/2018 05/04/15   Biagio Borg, MD  atorvastatin (LIPITOR) 10 MG tablet Take 1 tablet (10 mg total) by mouth daily. Patient not taking: Reported on 04/04/2018 01/20/18   Biagio Borg, MD  telmisartan-hydrochlorothiazide (MICARDIS HCT) 80-25 MG tablet Take 0.5 tablets by mouth daily. Patient not taking: Reported on 04/04/2018 01/20/18   Biagio Borg, MD    Family History Family History  Problem Relation Age of Onset  . Lung cancer Father   . Cancer Father        lung cancer  . Heart disease Mother   . Hypertension Mother   . Diabetes Other   . Colon cancer Neg Hx   . Esophageal cancer Neg Hx   . Rectal cancer Neg Hx   .  Stomach cancer Neg Hx     Social History Social History   Tobacco Use  . Smoking status: Never Smoker  . Smokeless tobacco: Never Used  Substance Use Topics  . Alcohol use: No  . Drug use: No     Allergies   Patient has no known allergies.   Review of Systems Review of Systems  Constitutional: Negative for fever.  Eyes: Positive for photophobia. Negative for visual disturbance.  Respiratory: Negative for shortness of breath.   Cardiovascular: Negative for chest pain.  Neurological: Positive for  light-headedness and headaches. Negative for dizziness, syncope, facial asymmetry, speech difficulty, weakness and numbness.  Psychiatric/Behavioral: Negative for confusion. The patient is nervous/anxious.   All other systems reviewed and are negative.    Physical Exam Updated Vital Signs BP 131/82   Pulse 70   Temp 98.7 F (37.1 C) (Oral)   Resp 16   Ht 5\' 1"  (1.549 m)   Wt 78.9 kg   SpO2 99%   BMI 32.88 kg/m   Physical Exam  Constitutional: She is oriented to person, place, and time. She appears well-developed and well-nourished.  HENT:  Head: Normocephalic and atraumatic.  Eyes: Pupils are equal, round, and reactive to light. Conjunctivae and EOM are normal.  Neck: Normal range of motion. Neck supple.  Cardiovascular: Normal rate, regular rhythm and normal heart sounds.  Pulmonary/Chest: Effort normal and breath sounds normal. No respiratory distress.  Abdominal: Soft. Bowel sounds are normal. She exhibits no distension. There is no tenderness.  Neurological: She is alert and oriented to person, place, and time.  Mental Status:  Alert, oriented, thought content appropriate, able to give a coherent history. Speech fluent without evidence of aphasia. Able to follow 2 step commands without difficulty.  Cranial Nerves:  II:  Peripheral visual fields grossly normal, pupils equal, round, reactive to light III,IV, VI: ptosis not present, extra-ocular motions intact bilaterally  V,VII: smile symmetric, facial light touch sensation equal VIII: hearing grossly normal to voice  X: uvula elevates symmetrically  XI: bilateral shoulder shrug symmetric and strong XII: midline tongue extension without fassiculations Motor:  Normal tone. 5/5 in upper and lower extremities bilaterally including strong and equal grip strength and dorsiflexion/plantar flexion Sensory: Pinprick and light touch normal in all extremities.  Deep Tendon Reflexes: 2+ and symmetric in the biceps and  patella Cerebellar: normal finger-to-nose with bilateral upper extremities Gait: normal gait and balance CV: distal pulses palpable throughout    Skin: Skin is warm.  Psychiatric:  Pt appears anxious and tearful  Nursing note and vitals reviewed.    ED Treatments / Results  Labs (all labs ordered are listed, but only abnormal results are displayed) Labs Reviewed  COMPREHENSIVE METABOLIC PANEL - Abnormal; Notable for the following components:      Result Value   Sodium 146 (*)    Total Protein 8.8 (*)    Albumin 5.3 (*)    All other components within normal limits  CBC WITH DIFFERENTIAL/PLATELET - Abnormal; Notable for the following components:   RBC 5.16 (*)    All other components within normal limits    EKG None  Radiology Ct Head Wo Contrast  Result Date: 04/04/2018 CLINICAL DATA:  Severe headache for 4 days. EXAM: CT HEAD WITHOUT CONTRAST TECHNIQUE: Contiguous axial images were obtained from the base of the skull through the vertex without intravenous contrast. COMPARISON:  None. FINDINGS: Brain: The ventricles are normal in size and configuration. No extra-axial fluid collections are identified. The gray-white differentiation  is maintained. No CT findings for acute hemispheric infarction or intracranial hemorrhage. No mass lesions. The brainstem and cerebellum are normal. Vascular: No hyperdense vessels or obvious aneurysm. Skull: No acute skull fracture.  No bone lesion. Sinuses/Orbits: The paranasal sinuses and mastoid air cells are clear. The globes are intact. Other: No scalp lesions, laceration or hematoma. IMPRESSION: Normal head CT. Electronically Signed   By: Marijo Sanes M.D.   On: 04/04/2018 14:16    Procedures Procedures (including critical care time)  Medications Ordered in ED Medications  fentaNYL (SUBLIMAZE) injection 50 mcg (50 mcg Intravenous Given 04/04/18 1358)  LORazepam (ATIVAN) injection 1 mg (1 mg Intravenous Given 04/04/18 1354)     Initial  Impression / Assessment and Plan / ED Course  I have reviewed the triage vital signs and the nursing notes.  Pertinent labs & imaging results that were available during my care of the patient were reviewed by me and considered in my medical decision making (see chart for details).     Patient presenting to the ED with complaints of acute onset of frontal headache that began prior to arrival.  Associated photophobia.  Patient reports history of frequent headaches.  No vision changes or nausea.  Patient is rather anxious on exam attributed to recent death of her brother, though patient has family in the room to console her.  Normal neurologic exam.  Given sudden onset, CT ordered to rule out bleed.  Labs are reassuring.  Normal normal vital signs.  CT scan is negative.  Headache and anxiety treated in the ED with improvement.  Patient discussed with Dr. Eulis Foster.  Will discharge with PCP follow-up.  Safe for discharge.  Discussed results, findings, treatment and follow up. Patient advised of return precautions. Patient verbalized understanding and agreed with plan.  Final Clinical Impressions(s) / ED Diagnoses   Final diagnoses:  Acute nonintractable headache, unspecified headache type    ED Discharge Orders    None       Robinson, [place] N, PA-C 04/04/18 1548    Daleen Bo, MD 04/05/18 [id number]

## 2019-01-26 ENCOUNTER — Encounter: Payer: Self-pay | Admitting: Internal Medicine

## 2019-01-26 ENCOUNTER — Ambulatory Visit (INDEPENDENT_AMBULATORY_CARE_PROVIDER_SITE_OTHER): Payer: BC Managed Care – PPO | Admitting: Internal Medicine

## 2019-01-26 ENCOUNTER — Other Ambulatory Visit: Payer: Self-pay

## 2019-01-26 ENCOUNTER — Other Ambulatory Visit: Payer: Self-pay | Admitting: Internal Medicine

## 2019-01-26 ENCOUNTER — Other Ambulatory Visit (INDEPENDENT_AMBULATORY_CARE_PROVIDER_SITE_OTHER): Payer: BC Managed Care – PPO

## 2019-01-26 VITALS — BP 118/78 | HR 77 | Temp 98.5°F | Ht 61.0 in | Wt 179.0 lb

## 2019-01-26 DIAGNOSIS — E538 Deficiency of other specified B group vitamins: Secondary | ICD-10-CM | POA: Diagnosis not present

## 2019-01-26 DIAGNOSIS — F329 Major depressive disorder, single episode, unspecified: Secondary | ICD-10-CM

## 2019-01-26 DIAGNOSIS — E559 Vitamin D deficiency, unspecified: Secondary | ICD-10-CM | POA: Diagnosis not present

## 2019-01-26 DIAGNOSIS — Z0001 Encounter for general adult medical examination with abnormal findings: Secondary | ICD-10-CM | POA: Diagnosis not present

## 2019-01-26 DIAGNOSIS — H9193 Unspecified hearing loss, bilateral: Secondary | ICD-10-CM

## 2019-01-26 DIAGNOSIS — Z0000 Encounter for general adult medical examination without abnormal findings: Secondary | ICD-10-CM | POA: Diagnosis not present

## 2019-01-26 DIAGNOSIS — E611 Iron deficiency: Secondary | ICD-10-CM | POA: Diagnosis not present

## 2019-01-26 DIAGNOSIS — E785 Hyperlipidemia, unspecified: Secondary | ICD-10-CM

## 2019-01-26 DIAGNOSIS — I10 Essential (primary) hypertension: Secondary | ICD-10-CM

## 2019-01-26 DIAGNOSIS — F32A Depression, unspecified: Secondary | ICD-10-CM

## 2019-01-26 LAB — CBC WITH DIFFERENTIAL/PLATELET
Basophils Absolute: 0 10*3/uL (ref 0.0–0.1)
Basophils Relative: 0.3 % (ref 0.0–3.0)
Eosinophils Absolute: 0 10*3/uL (ref 0.0–0.7)
Eosinophils Relative: 0.4 % (ref 0.0–5.0)
HCT: 41.5 % (ref 36.0–46.0)
Hemoglobin: 13.8 g/dL (ref 12.0–15.0)
Lymphocytes Relative: 38.9 % (ref 12.0–46.0)
Lymphs Abs: 2.8 10*3/uL (ref 0.7–4.0)
MCHC: 33.2 g/dL (ref 30.0–36.0)
MCV: 88.3 fl (ref 78.0–100.0)
Monocytes Absolute: 0.5 10*3/uL (ref 0.1–1.0)
Monocytes Relative: 7.1 % (ref 3.0–12.0)
Neutro Abs: 3.8 10*3/uL (ref 1.4–7.7)
Neutrophils Relative %: 53.3 % (ref 43.0–77.0)
Platelets: 219 10*3/uL (ref 150.0–400.0)
RBC: 4.7 Mil/uL (ref 3.87–5.11)
RDW: 14.1 % (ref 11.5–15.5)
WBC: 7.1 10*3/uL (ref 4.0–10.5)

## 2019-01-26 LAB — LIPID PANEL
Cholesterol: 208 mg/dL — ABNORMAL HIGH (ref 0–200)
HDL: 42.5 mg/dL (ref >39.00)
LDL Cholesterol: 142 mg/dL — ABNORMAL HIGH (ref 0–99)
NonHDL: 165.46
Total CHOL/HDL Ratio: 5
Triglycerides: 119 mg/dL (ref 0.0–149.0)
VLDL: 23.8 mg/dL (ref 0.0–40.0)

## 2019-01-26 LAB — IBC PANEL
Iron: 93 ug/dL (ref 42–145)
Saturation Ratios: 29.7 % (ref 20.0–50.0)
Transferrin: 224 mg/dL (ref 212.0–360.0)

## 2019-01-26 LAB — BASIC METABOLIC PANEL
BUN: 9 mg/dL (ref 6–23)
CO2: 26 mEq/L (ref 19–32)
Calcium: 9.7 mg/dL (ref 8.4–10.5)
Chloride: 107 mEq/L (ref 96–112)
Creatinine, Ser: 0.88 mg/dL (ref 0.40–1.20)
GFR: 80.25 mL/min (ref >60.00)
Glucose, Bld: 100 mg/dL — ABNORMAL HIGH (ref 70–99)
Potassium: 4.6 mEq/L (ref 3.5–5.1)
Sodium: 142 mEq/L (ref 135–145)

## 2019-01-26 LAB — URINALYSIS, ROUTINE W REFLEX MICROSCOPIC
Bilirubin Urine: NEGATIVE
Hgb urine dipstick: NEGATIVE
Ketones, ur: NEGATIVE
Nitrite: NEGATIVE
RBC / HPF: NONE SEEN (ref 0–?)
Specific Gravity, Urine: >=1.03 — AB (ref 1.000–1.030)
Total Protein, Urine: NEGATIVE
Urine Glucose: NEGATIVE
Urobilinogen, UA: 0.2 (ref 0.0–1.0)
pH: 5.5 (ref 5.0–8.0)

## 2019-01-26 LAB — VITAMIN B12: Vitamin B-12: 897 pg/mL (ref 211–911)

## 2019-01-26 LAB — HEPATIC FUNCTION PANEL
ALT: 28 U/L (ref 0–35)
AST: 21 U/L (ref 0–37)
Albumin: 4.8 g/dL (ref 3.5–5.2)
Alkaline Phosphatase: 87 U/L (ref 39–117)
Bilirubin, Direct: 0.1 mg/dL (ref 0.0–0.3)
Total Bilirubin: 0.4 mg/dL (ref 0.2–1.2)
Total Protein: 8 g/dL (ref 6.0–8.3)

## 2019-01-26 LAB — TSH: TSH: 0.91 u[IU]/mL (ref 0.35–4.50)

## 2019-01-26 LAB — VITAMIN D 25 HYDROXY (VIT D DEFICIENCY, FRACTURES): VITD: 19.34 ng/mL — ABNORMAL LOW (ref 30.00–100.00)

## 2019-01-26 MED ORDER — ATORVASTATIN CALCIUM 10 MG PO TABS: 10.0000 mg | ORAL_TABLET | Freq: Every day | ORAL | 3 refills | Status: DC

## 2019-01-26 MED ORDER — VITAMIN D (ERGOCALCIFEROL) 1.25 MG (50000 UNIT) PO CAPS: 50000.0000 [IU] | ORAL_CAPSULE | ORAL | 0 refills | Status: DC

## 2019-01-26 NOTE — Progress Notes (Signed)
Subjective:    Patient ID: Susan Mejia, female    DOB: 30-Mar-1961, 58 y.o.   MRN: [id number]  HPI  Here for wellness and f/u;  Overall doing ok;  Pt denies Chest pain, worsening SOB, DOE, wheezing, orthopnea, PND, worsening LE edema, palpitations, dizziness or syncope.  Pt denies neurological change such as new headache, facial or extremity weakness.  Pt denies polydipsia, polyuria, or low sugar symptoms. Pt states overall good compliance with treatment and medications, good tolerability, and has been trying to follow appropriate diet.  Pt denies worsening depressive symptoms, suicidal ideation or panic. No fever, night sweats, wt loss, loss of appetite, or other constitutional symptoms.  Pt states good ability with ADL's, has low fall risk, home safety reviewed and adequate, no other significant changes in hearing or vision, and only occasionally active with exercise. Not taking statin, take BP med only occasionally "when I feel it going up."   Wt Readings from Last 3 Encounters:  01/26/19 179 lb (81.2 kg)  04/04/18 174 lb (78.9 kg)  01/20/18 174 lb (78.9 kg)  Also has bilat hearing reduced for the last wk, but without HA, pain, fever, ST, cough, sinus congestion or ear canal discharge.  Has tried OTC to remove wax but not successful. Past Medical History:  Diagnosis Date  . Essential hypertension, benign 11/17/2013  . Frequent headaches   . Hyperlipidemia    diet controlled  . Obesity   . Thyroid disease    hyperactive after pregancy, normal now, no meds   Past Surgical History:  Procedure Laterality Date  . CHOLECYSTECTOMY    . TONSILLECTOMY      reports that she has never smoked. She has never used smokeless tobacco. She reports that she does not drink alcohol or use drugs. family history includes Cancer in her father; Diabetes in an other family member; Heart disease in her mother; Hypertension in her mother; Lung cancer in her father. No Known Allergies Current Outpatient  Medications on File Prior to Visit  Medication Sig Dispense Refill  . aspirin EC 81 MG tablet Take 1 tablet (81 mg total) by mouth daily. 90 tablet 11  . diphenhydramine-acetaminophen (TYLENOL PM) 25-500 MG TABS tablet Take 1 tablet by mouth at bedtime as needed (for sleep).    . pantoprazole (PROTONIX) 40 MG tablet Take 1 tablet (40 mg total) by mouth daily as needed. (Patient taking differently: Take 40 mg by mouth daily as needed (for acid reflux). ) 90 tablet 3  . telmisartan-hydrochlorothiazide (MICARDIS HCT) 80-25 MG tablet Take 0.5 tablets by mouth daily. 45 tablet 3   No current facility-administered medications on file prior to visit.    Review of Systems Constitutional: Negative for other unusual diaphoresis, sweats, appetite or weight changes HENT: Negative for other worsening hearing loss, ear pain, facial swelling, mouth sores or neck stiffness.   Eyes: Negative for other worsening pain, redness or other visual disturbance.  Respiratory: Negative for other stridor or swelling Cardiovascular: Negative for other palpitations or other chest pain  Gastrointestinal: Negative for worsening diarrhea or loose stools, blood in stool, distention or other pain Genitourinary: Negative for hematuria, flank pain or other change in urine volume.  Musculoskeletal: Negative for myalgias or other joint swelling.  Skin: Negative for other color change, or other wound or worsening drainage.  Neurological: Negative for other syncope or numbness. Hematological: Negative for other adenopathy or swelling Psychiatric/Behavioral: Negative for hallucinations, other worsening agitation, SI, self-injury, or new decreased concentration All other system  neg per pt    Objective:   Physical Exam BP 118/78   Pulse 77   Temp 98.5 F (36.9 C) (Oral)   Ht 5\' 1"  (1.549 m)   Wt 179 lb (81.2 kg)   SpO2 98%   BMI 33.82 kg/m  VS noted,  Constitutional: Pt is oriented to person, place, and time. Appears  well-developed and well-nourished, in no significant distress and comfortable Head: Normocephalic and atraumatic  Eyes: Conjunctivae and EOM are normal. Pupils are equal, round, and reactive to light Right Ear: External ear normal without discharge Left Ear: External ear normal without discharge Bilateral ear canal wax impactions resolved with irrigation Nose: Nose without discharge or deformity Mouth/Throat: Oropharynx is without other ulcerations and moist  Neck: Normal range of motion. Neck supple. No JVD present. No tracheal deviation present or significant neck LA or mass Cardiovascular: Normal rate, regular rhythm, normal heart sounds and intact distal pulses.   Pulmonary/Chest: WOB normal and breath sounds without rales or wheezing  Abdominal: Soft. Bowel sounds are normal. NT. No HSM  Musculoskeletal: Normal range of motion. Exhibits no edema Lymphadenopathy: Has no other cervical adenopathy.  Neurological: Pt is alert and oriented to person, place, and time. Pt has normal reflexes. No cranial nerve deficit. Motor grossly intact, Gait intact Skin: Skin is warm and dry. No rash noted or new ulcerations Psychiatric:  Has normal mood and affect. Behavior is normal without agitation No other exam findings Lab Results  Component Value Date   WBC 7.1 01/26/2019   HGB 13.8 01/26/2019   HCT 41.5 01/26/2019   PLT 219.0 01/26/2019   GLUCOSE 100 (H) 01/26/2019   CHOL 208 (H) 01/26/2019   TRIG 119.0 01/26/2019   HDL 42.50 01/26/2019   LDLDIRECT 158.0 05/03/2015   LDLCALC 142 (H) 01/26/2019   ALT 28 01/26/2019   AST 21 01/26/2019   NA 142 01/26/2019   K 4.6 01/26/2019   CL 107 01/26/2019   CREATININE 0.88 01/26/2019   BUN 9 01/26/2019   CO2 26 01/26/2019   TSH 0.91 01/26/2019       Assessment & Plan:

## 2019-01-26 NOTE — Patient Instructions (Signed)
Your ears were irrigated of wax today  Please continue all other medications as before, and refills have been done if requested.  Please have the pharmacy call with any other refills you may need.  Please continue your efforts at being more active, low cholesterol diet, and weight control.  You are otherwise up to date with prevention measures today.  Please keep your appointments with your specialists as you may have planned  Please go to the LAB in the Basement (turn left off the elevator) for the tests to be done today  You will be contacted by phone if any changes need to be made immediately.  Otherwise, you will receive a letter about your results with an explanation, but please check with MyChart first.  Please remember to sign up for MyChart if you have not done so, as this will be important to you in the future with finding out test results, communicating by private email, and scheduling acute appointments online when needed.  Please return in 1 year for your yearly visit, or sooner if needed, with Lab testing done 3-5 days before

## 2019-01-27 ENCOUNTER — Telehealth: Payer: Self-pay

## 2019-01-27 NOTE — Telephone Encounter (Signed)
-----   Message from Biagio Borg, MD sent at 01/26/2019 12:52 PM EDT ----- Letter sent, cont same tx except  The test results show that your current treatment is OK, except the LDL cholesterol is quite high, and the Vitamin D level is low.  Please restart your cholesterol medication, and Please take Vitamin D 50000 units weekly for 12 weeks, then plan to change to OTC Vitamin D3 at 2000 units per day, indefinitely.    Susan Mejia to please inform pt, I will do rx x 2

## 2019-01-27 NOTE — Telephone Encounter (Signed)
Pt has been informed of results and expressed understanding.  °

## 2019-01-29 ENCOUNTER — Encounter: Payer: Self-pay | Admitting: Internal Medicine

## 2019-01-29 DIAGNOSIS — H9193 Unspecified hearing loss, bilateral: Secondary | ICD-10-CM | POA: Insufficient documentation

## 2019-01-29 NOTE — Assessment & Plan Note (Signed)

## 2019-01-29 NOTE — Assessment & Plan Note (Signed)
stable overall by history and exam, recent data reviewed with pt, and pt to continue medical treatment as before,  to f/u any worsening symptoms or concerns  

## 2019-01-29 NOTE — Assessment & Plan Note (Addendum)
Resolved with irrigation,  to f/u any worsening symptoms or concerns  In addition to the time spent performing CPE, I spent an additional 15 minutes face to face,in which greater than 50% of this time was spent in counseling and coordination of care for patient's illness as documented, including the differential dx, treatment, further evaluation and other management of bilateral hearing loss, HLD, HTN, depression

## 2019-01-29 NOTE — Assessment & Plan Note (Signed)
stable overall by history and exam, recent data reviewed with pt, and pt to continue medical treatment as before,  to f/u any worsening symptoms or concerns, likely needs statin restart

## 2019-04-23 ENCOUNTER — Ambulatory Visit (INDEPENDENT_AMBULATORY_CARE_PROVIDER_SITE_OTHER): Payer: BC Managed Care – PPO

## 2019-04-23 DIAGNOSIS — Z23 Encounter for immunization: Secondary | ICD-10-CM | POA: Diagnosis not present

## 2019-08-30 DIAGNOSIS — E7800 Pure hypercholesterolemia, unspecified: Secondary | ICD-10-CM | POA: Insufficient documentation

## 2019-08-30 DIAGNOSIS — I10 Essential (primary) hypertension: Secondary | ICD-10-CM | POA: Insufficient documentation

## 2019-09-26 ENCOUNTER — Other Ambulatory Visit: Payer: Self-pay | Admitting: Internal Medicine

## 2019-10-01 ENCOUNTER — Ambulatory Visit: Payer: BC Managed Care – PPO | Attending: Internal Medicine

## 2019-10-01 DIAGNOSIS — Z23 Encounter for immunization: Secondary | ICD-10-CM

## 2019-10-01 NOTE — Progress Notes (Signed)
   Covid-19 Vaccination Clinic  Name:  Susan Mejia    MRN: [id number] DOB: 08-05-1960  10/01/2019  [name] was observed post Covid-19 immunization for 15 minutes without incident. She was provided with Vaccine Information Sheet and instruction to access the V-Safe system.   [name] was instructed to call 911 with any severe reactions post vaccine: Marland Kitchen Difficulty breathing  . Swelling of face and throat  . A fast heartbeat  . A bad rash all over body  . Dizziness and weakness   Immunizations Administered    Name Date Dose VIS Date Route   Moderna COVID-19 Vaccine 10/01/2019 10:43 AM 0.5 mL 06/21/2019 Intramuscular   Manufacturer: Moderna   Lot: [id number]   [id number]

## 2019-10-05 LAB — RESULTS CONSOLE HPV: CHL HPV: NEGATIVE

## 2019-10-05 LAB — HM MAMMOGRAPHY

## 2019-11-02 ENCOUNTER — Ambulatory Visit: Payer: BC Managed Care – PPO | Attending: Internal Medicine

## 2019-11-02 DIAGNOSIS — Z23 Encounter for immunization: Secondary | ICD-10-CM

## 2019-11-02 NOTE — Progress Notes (Signed)
   Covid-19 Vaccination Clinic  Name:  Susan Mejia    MRN: [id number] DOB: Sep 28, 1960  11/02/2019  Susan Mejia was observed post Covid-19 immunization for 15 minutes without incident. She was provided with Vaccine Information Sheet and instruction to access the V-Safe system.   Susan Mejia was instructed to call 911 with any severe reactions post vaccine: Marland Kitchen Difficulty breathing  . Swelling of face and throat  . A fast heartbeat  . A bad rash all over body  . Dizziness and weakness   Immunizations Administered    Name Date Dose VIS Date Route   Moderna COVID-19 Vaccine 11/02/2019  9:41 AM 0.5 mL 06/21/2019 Intramuscular   Manufacturer: Moderna   Lot: [id number]   [place][id number]

## 2019-11-04 ENCOUNTER — Other Ambulatory Visit: Payer: Self-pay | Admitting: Internal Medicine

## 2020-01-27 ENCOUNTER — Ambulatory Visit (INDEPENDENT_AMBULATORY_CARE_PROVIDER_SITE_OTHER): Payer: BC Managed Care – PPO | Admitting: Internal Medicine

## 2020-01-27 ENCOUNTER — Encounter: Payer: Self-pay | Admitting: Internal Medicine

## 2020-01-27 ENCOUNTER — Other Ambulatory Visit: Payer: Self-pay

## 2020-01-27 VITALS — BP 116/82 | HR 80 | Temp 98.3°F | Ht 61.0 in | Wt 177.0 lb

## 2020-01-27 DIAGNOSIS — E559 Vitamin D deficiency, unspecified: Secondary | ICD-10-CM

## 2020-01-27 DIAGNOSIS — H9193 Unspecified hearing loss, bilateral: Secondary | ICD-10-CM | POA: Diagnosis not present

## 2020-01-27 DIAGNOSIS — E538 Deficiency of other specified B group vitamins: Secondary | ICD-10-CM | POA: Diagnosis not present

## 2020-01-27 DIAGNOSIS — Z0000 Encounter for general adult medical examination without abnormal findings: Secondary | ICD-10-CM

## 2020-01-27 DIAGNOSIS — I10 Essential (primary) hypertension: Secondary | ICD-10-CM

## 2020-01-27 DIAGNOSIS — E785 Hyperlipidemia, unspecified: Secondary | ICD-10-CM | POA: Diagnosis not present

## 2020-01-27 DIAGNOSIS — Z00129 Encounter for routine child health examination without abnormal findings: Secondary | ICD-10-CM

## 2020-01-27 DIAGNOSIS — Z0001 Encounter for general adult medical examination with abnormal findings: Secondary | ICD-10-CM | POA: Diagnosis not present

## 2020-01-27 DIAGNOSIS — R739 Hyperglycemia, unspecified: Secondary | ICD-10-CM

## 2020-01-27 MED ORDER — PANTOPRAZOLE SODIUM 40 MG PO TBEC: 40.0000 mg | DELAYED_RELEASE_TABLET | Freq: Every day | ORAL | 3 refills | Status: DC | PRN

## 2020-01-27 MED ORDER — ATORVASTATIN CALCIUM 10 MG PO TABS: 10.0000 mg | ORAL_TABLET | Freq: Every day | ORAL | 3 refills | Status: DC

## 2020-01-27 MED ORDER — IRBESARTAN-HYDROCHLOROTHIAZIDE 150-12.5 MG PO TABS: 1.0000 | ORAL_TABLET | Freq: Every day | ORAL | 3 refills | Status: DC

## 2020-01-27 MED ORDER — PHENTERMINE HCL 37.5 MG PO CAPS: 37.5000 mg | ORAL_CAPSULE | ORAL | 2 refills | Status: DC

## 2020-01-27 NOTE — Addendum Note (Signed)
Addended by: Cresenciano Lick on: 01/27/2020 10:13 AM   Modules accepted: Orders

## 2020-01-27 NOTE — Progress Notes (Addendum)
Subjective:    Patient ID: Susan Mejia, female    DOB: 05-26-1961, 59 y.o.   MRN: [id number]  HPI Here for wellness and f/u;  Overall doing ok;  Pt denies Chest pain, worsening SOB, DOE, wheezing, orthopnea, PND, worsening LE edema, palpitations, dizziness or syncope.  Pt denies neurological change such as new headache, facial or extremity weakness.  Pt denies polydipsia, polyuria, or low sugar symptoms. Pt states overall good compliance with treatment and medications, good tolerability, and has been trying to follow appropriate diet.  Pt denies worsening depressive symptoms, suicidal ideation or panic. No fever, night sweats, wt loss, loss of appetite, or other constitutional symptoms.  Pt states good ability with ADL's, has low fall risk, home safety reviewed and adequate, no other significant changes in hearing or vision, and only occasionally active with exercise. Hard to lose wt, unable recently.  Is interested in Ct cardiac score  Also with bilateral hearing loss with possible wax impactions, no pain, HA, fever, ear d/c. Past Medical History:  Diagnosis Date  . Essential hypertension, benign 11/17/2013  . Frequent headaches   . Hyperlipidemia    diet controlled  . Obesity   . Thyroid disease    hyperactive after pregancy, normal now, no meds   Past Surgical History:  Procedure Laterality Date  . CHOLECYSTECTOMY    . TONSILLECTOMY      reports that she has never smoked. She has never used smokeless tobacco. She reports that she does not drink alcohol and does not use drugs. family history includes Cancer in her father; Diabetes in an other family member; Heart disease in her mother; Hypertension in her mother; Lung cancer in her father. No Known Allergies Current Outpatient Medications on File Prior to Visit  Medication Sig Dispense Refill  . latanoprost (XALATAN) 0.005 % ophthalmic solution Place 1 drop into both eyes nightly.    Marland Kitchen aspirin EC 81 MG tablet Take 1 tablet (81 mg  total) by mouth daily. 90 tablet 11  . atorvastatin (LIPITOR) 10 MG tablet Take 1 tablet (10 mg total) by mouth daily. 90 tablet 3  . diphenhydramine-acetaminophen (TYLENOL PM) 25-500 MG TABS tablet Take 1 tablet by mouth at bedtime as needed (for sleep).    . irbesartan-hydrochlorothiazide (AVALIDE) 150-12.5 MG tablet Take 1 tablet by mouth daily. Annual appt due in July must see provider for future refills 90 tablet 0  . pantoprazole (PROTONIX) 40 MG tablet Take 1 tablet (40 mg total) by mouth daily as needed. (Patient taking differently: Take 40 mg by mouth daily as needed (for acid reflux). ) 90 tablet 3  . trimethoprim-polymyxin b (POLYTRIM) ophthalmic solution 1 drop every 3 (three) hours.    . Zoster Vaccine Adjuvanted [facility]) injection Shingrix (PF) 50 mcg/0.5 mL intramuscular suspension, kit     No current facility-administered medications on file prior to visit.   Review of Systems All otherwise neg per pt     Objective:   Physical Exam BP 116/82 (BP Location: Left Arm, Patient Position: Sitting, Cuff Size: Large)   Pulse 80   Temp 98.3 F (36.8 C) (Oral)   Ht _0  (1.549 m)   Wt 177 lb (80.3 kg)   SpO2 98%   BMI 33.44 kg/m  VS noted,  Constitutional: Pt appears in NAD HENT: Head: NCAT.  Right Ear: External ear normal.  Left Ear: External ear normal.  Eyes: . Pupils are equal, round, and reactive to light. Conjunctivae and EOM are normal Nose: without d/c  or deformity Hearing bilateral improved with irrigation bilat Neck: Neck supple. Gross normal ROM Cardiovascular: Normal rate and regular rhythm.   Pulmonary/Chest: Effort normal and breath sounds without rales or wheezing.  Abd:  Soft, NT, ND, + BS, no organomegaly Neurological: Pt is alert. At baseline orientation, motor grossly intact Skin: Skin is warm. No rashes, other new lesions, no LE edema Psychiatric: Pt behavior is normal without agitation  All otherwise neg per pt Lab Results  Component Value Date     WBC 7.1 01/26/2019   HGB 13.8 01/26/2019   HCT 41.5 01/26/2019   PLT 219.0 01/26/2019   GLUCOSE 100 (H) 01/26/2019   CHOL 208 (H) 01/26/2019   TRIG 119.0 01/26/2019   HDL 42.50 01/26/2019   LDLDIRECT 158.0 05/03/2015   LDLCALC 142 (H) 01/26/2019   ALT 28 01/26/2019   AST 21 01/26/2019   NA 142 01/26/2019   K 4.6 01/26/2019   CL 107 01/26/2019   CREATININE 0.88 01/26/2019   BUN 9 01/26/2019   CO2 26 01/26/2019   TSH 0.91 01/26/2019       Assessment & Plan:

## 2020-01-27 NOTE — Assessment & Plan Note (Addendum)
Resolved with irrigation and improved hearing  I spent 31 minutes in addition to time for CPX wellness examination in preparing to see the patient by review of recent labs, imaging and procedures, obtaining and reviewing separately obtained history, communicating with the patient and family or caregiver, ordering medications, tests or procedures, and documenting clinical information in the EHR including the differential Dx, treatment, and any further evaluation and other management of hearing loss, vit d def, hld, hyperglycemia, htn

## 2020-01-27 NOTE — Assessment & Plan Note (Signed)
Cont oral replacmeent

## 2020-01-27 NOTE — Assessment & Plan Note (Signed)
stable overall by history and exam, recent data reviewed with pt, and pt to continue medical treatment as before,  to f/u any worsening symptoms or concerns  

## 2020-01-27 NOTE — Patient Instructions (Signed)
Please take all new medication as prescribed - the phentermine  We have discussed the Cardiac CT Score test to measure the calcification level (if any) in your heart arteries.  This test has been ordered in our [place], so please call [facility] directly, as they prefer this, at [phone] to be scheduled.  Please continue all other medications as before, and refills have been done if requested.  Please have the pharmacy call with any other refills you may need.  Please continue your efforts at being more active, low cholesterol diet, and weight control.  You are otherwise up to date with prevention measures today.  Please keep your appointments with your specialists as you may have planned  Please go to the LAB at the blood drawing area for the tests to be done  You will be contacted by phone if any changes need to be made immediately.  Otherwise, you will receive a letter about your results with an explanation, but please check with MyChart first.  Please remember to sign up for MyChart if you have not done so, as this will be important to you in the future with finding out test results, communicating by private email, and scheduling acute appointments online when needed.  Please make an Appointment to return for your 1 year visit, or sooner if needed

## 2020-01-27 NOTE — Assessment & Plan Note (Signed)
Cont statin, for ct cardiac scoring

## 2020-01-27 NOTE — Assessment & Plan Note (Signed)

## 2020-01-28 ENCOUNTER — Encounter: Payer: Self-pay | Admitting: Internal Medicine

## 2020-01-28 LAB — BASIC METABOLIC PANEL
BUN: 14 mg/dL (ref 7–25)
CO2: 26 mmol/L (ref 20–32)
Calcium: 10.3 mg/dL (ref 8.6–10.4)
Chloride: 103 mmol/L (ref 98–110)
Creat: 0.91 mg/dL (ref 0.50–1.05)
Glucose, Bld: 97 mg/dL (ref 65–99)
Potassium: 4.2 mmol/L (ref 3.5–5.3)
Sodium: 140 mmol/L (ref 135–146)

## 2020-01-28 LAB — URINALYSIS, ROUTINE W REFLEX MICROSCOPIC
Bacteria, UA: NONE SEEN /HPF
Bilirubin Urine: NEGATIVE
Glucose, UA: NEGATIVE
Hgb urine dipstick: NEGATIVE
Hyaline Cast: NONE SEEN /LPF
Ketones, ur: NEGATIVE
Nitrite: NEGATIVE
Protein, ur: NEGATIVE
Specific Gravity, Urine: 1.017 (ref 1.001–1.03)
WBC, UA: NONE SEEN /HPF (ref 0–5)
pH: 5.5 (ref 5.0–8.0)

## 2020-01-28 LAB — HEMOGLOBIN A1C
Hgb A1c MFr Bld: 6 % of total Hgb — ABNORMAL HIGH (ref <5.7)
Mean Plasma Glucose: 126 (calc)
eAG (mmol/L): 7 (calc)

## 2020-01-28 LAB — CBC WITH DIFFERENTIAL/PLATELET
Absolute Monocytes: 480 cells/uL (ref 200–950)
Basophils Absolute: 24 cells/uL (ref 0–200)
Basophils Relative: 0.3 %
Eosinophils Absolute: 32 cells/uL (ref 15–500)
Eosinophils Relative: 0.4 %
HCT: 44.2 % (ref 35.0–45.0)
Hemoglobin: 14.3 g/dL (ref 11.7–15.5)
Lymphs Abs: 3328 cells/uL (ref 850–3900)
MCH: 29 pg (ref 27.0–33.0)
MCHC: 32.4 g/dL (ref 32.0–36.0)
MCV: 89.7 fL (ref 80.0–100.0)
MPV: 11.4 fL (ref 7.5–12.5)
Monocytes Relative: 6 %
Neutro Abs: 4136 cells/uL (ref 1500–7800)
Neutrophils Relative %: 51.7 %
Platelets: 257 10*3/uL (ref 140–400)
RBC: 4.93 10*6/uL (ref 3.80–5.10)
RDW: 13.1 % (ref 11.0–15.0)
Total Lymphocyte: 41.6 %
WBC: 8 10*3/uL (ref 3.8–10.8)

## 2020-01-28 LAB — HEPATIC FUNCTION PANEL
AG Ratio: 1.6 (calc) (ref 1.0–2.5)
ALT: 26 U/L (ref 6–29)
AST: 20 U/L (ref 10–35)
Albumin: 4.9 g/dL (ref 3.6–5.1)
Alkaline phosphatase (APISO): 88 U/L (ref 37–153)
Bilirubin, Direct: 0.1 mg/dL (ref 0.0–0.2)
Globulin: 3 g/dL (calc) (ref 1.9–3.7)
Indirect Bilirubin: 0.3 mg/dL (calc) (ref 0.2–1.2)
Total Bilirubin: 0.4 mg/dL (ref 0.2–1.2)
Total Protein: 7.9 g/dL (ref 6.1–8.1)

## 2020-01-28 LAB — LIPID PANEL
Cholesterol: 190 mg/dL (ref <200)
HDL: 52 mg/dL (ref >=50)
LDL Cholesterol (Calc): 115 mg/dL (calc) — ABNORMAL HIGH
Non-HDL Cholesterol (Calc): 138 mg/dL (calc) — ABNORMAL HIGH (ref <130)
Total CHOL/HDL Ratio: 3.7 (calc) (ref <5.0)
Triglycerides: 121 mg/dL (ref <150)

## 2020-01-28 LAB — VITAMIN B12: Vitamin B-12: 701 pg/mL (ref 200–1100)

## 2020-01-28 LAB — TSH: TSH: 0.81 mIU/L (ref 0.40–4.50)

## 2020-01-28 LAB — VITAMIN D 25 HYDROXY (VIT D DEFICIENCY, FRACTURES): Vit D, 25-Hydroxy: 31 ng/mL (ref 30–100)

## 2020-02-21 ENCOUNTER — Encounter: Payer: Self-pay | Admitting: Internal Medicine

## 2020-03-13 ENCOUNTER — Ambulatory Visit (INDEPENDENT_AMBULATORY_CARE_PROVIDER_SITE_OTHER)
Admission: RE | Admit: 2020-03-13 | Discharge: 2020-03-13 | Disposition: A | Discharge status: Home / Self Care | Payer: Self-pay | Source: Ambulatory Visit | Attending: Internal Medicine | Admitting: Internal Medicine

## 2020-03-13 ENCOUNTER — Other Ambulatory Visit: Payer: Self-pay

## 2020-03-13 DIAGNOSIS — R739 Hyperglycemia, unspecified: Secondary | ICD-10-CM

## 2020-03-13 DIAGNOSIS — E785 Hyperlipidemia, unspecified: Secondary | ICD-10-CM

## 2020-03-14 ENCOUNTER — Encounter: Payer: Self-pay | Admitting: Internal Medicine

## 2020-05-01 ENCOUNTER — Ambulatory Visit: Payer: BC Managed Care – PPO | Attending: Critical Care Medicine

## 2020-05-01 DIAGNOSIS — Z23 Encounter for immunization: Secondary | ICD-10-CM

## 2020-05-01 NOTE — Progress Notes (Signed)
   Covid-19 Vaccination Clinic  Name:  Susan Mejia    MRN: [id number] DOB: [birth year]  05/01/2020  [name] was observed post Covid-19 immunization for 15 minutes without incident. She was provided with Vaccine Information Sheet and instruction to access the V-Safe system.   [name] was instructed to call 911 with any severe reactions post vaccine: Marland Kitchen Difficulty breathing  . Swelling of face and throat  . A fast heartbeat  . A bad rash all over body  . Dizziness and weakness

## 2020-05-10 ENCOUNTER — Other Ambulatory Visit: Payer: Self-pay | Admitting: Internal Medicine

## 2020-05-10 NOTE — Telephone Encounter (Signed)
Done erx 

## 2020-05-23 ENCOUNTER — Other Ambulatory Visit: Payer: Self-pay

## 2020-05-23 ENCOUNTER — Ambulatory Visit (INDEPENDENT_AMBULATORY_CARE_PROVIDER_SITE_OTHER): Payer: BC Managed Care – PPO

## 2020-05-23 DIAGNOSIS — Z23 Encounter for immunization: Secondary | ICD-10-CM

## 2020-10-08 ENCOUNTER — Encounter: Payer: Self-pay | Admitting: Internal Medicine

## 2020-11-12 ENCOUNTER — Encounter: Payer: Self-pay | Admitting: Gastroenterology

## 2020-11-19 ENCOUNTER — Other Ambulatory Visit: Payer: Self-pay | Admitting: Internal Medicine

## 2020-11-28 ENCOUNTER — Encounter: Payer: Self-pay | Admitting: Gastroenterology

## 2020-11-28 ENCOUNTER — Telehealth: Payer: Self-pay

## 2020-11-28 ENCOUNTER — Ambulatory Visit: Payer: BC Managed Care – PPO | Admitting: Gastroenterology

## 2020-11-28 VITALS — BP 124/82 | HR 80 | Ht 61.0 in | Wt 160.4 lb

## 2020-11-28 DIAGNOSIS — K648 Other hemorrhoids: Secondary | ICD-10-CM | POA: Diagnosis not present

## 2020-11-28 DIAGNOSIS — K625 Hemorrhage of anus and rectum: Secondary | ICD-10-CM

## 2020-11-28 MED ORDER — HYDROCORTISONE ACETATE 25 MG RE SUPP: 25.0000 mg | Freq: Every day | RECTAL | 2 refills | Status: DC

## 2020-11-28 NOTE — Progress Notes (Signed)
11/28/2020 Susan KARWOWSKI [id number] 1961/07/15   HISTORY OF PRESENT ILLNESS: This is a 60 year old female is a patient of Dr. Vena Rua known him only for colonoscopy in December 2014.  She presents here today with complaints of rectal bleeding.  She tells me that she has had intermittent bright red rectal bleeding for quite some time.  She says that at theof end  last year she had an episode where she had bright red blood in the toilet water.  She says that intermittently when she wipes she sees a little bit of bright red blood on the toilet paper.  The only treatment that she has used in the past has been Preparation H and she has not used anything recently.  She says that she occasionally gets constipated, but for the most part she has a bowel movement every day in the mornings.  She says that she has had to be evaluated in the emergency department in the past due to perianal pain/discomfort.  Her last colonoscopy was in December 2014 at which time she was found to have only diverticulosis.  She was referred here by Dr. Zenia Resides from [place].   Past Medical History:  Diagnosis Date  . Essential hypertension, benign 11/17/2013  . Frequent headaches   . Gallstones   . GERD (gastroesophageal reflux disease)   . Hyperlipidemia    diet controlled  . Obesity   . Thyroid disease    hyperactive after pregancy, normal now, no meds   Past Surgical History:  Procedure Laterality Date  . CHOLECYSTECTOMY    . TONSILLECTOMY      reports that she has never smoked. She has never used smokeless tobacco. She reports that she does not drink alcohol and does not use drugs. family history includes Cancer in her father; Diabetes in an other family member; Heart disease in her mother; Hypertension in her mother; Lung cancer in her father. No Known Allergies    Outpatient Encounter Medications as of 11/28/2020  Medication Sig  . aspirin EC 81 MG tablet Take 1 tablet (81 mg total) by mouth daily.  Marland Kitchen  atorvastatin (LIPITOR) 10 MG tablet Take 1 tablet (10 mg total) by mouth daily.  . diphenhydramine-acetaminophen (TYLENOL PM) 25-500 MG TABS tablet Take 1 tablet by mouth at bedtime as needed (for sleep).  . irbesartan-hydrochlorothiazide (AVALIDE) 150-12.5 MG tablet Take 1 tablet by mouth daily.  Marland Kitchen latanoprost (XALATAN) 0.005 % ophthalmic solution Place 1 drop into both eyes nightly.  . pantoprazole (PROTONIX) 40 MG tablet Take 1 tablet (40 mg total) by mouth daily as needed.  . phentermine 37.5 MG capsule Take 1 capsule by mouth in the morning  . trimethoprim-polymyxin b (POLYTRIM) ophthalmic solution 1 drop every 3 (three) hours.   No facility-administered encounter medications on file as of 11/28/2020.     REVIEW OF SYSTEMS  : All other systems reviewed and negative except where noted in the History of Present Illness.   PHYSICAL EXAM: BP 124/82   Pulse 80   Ht 5\' 1"  (1.549 m)   Wt 160 lb 6.4 oz (72.8 kg)   SpO2 96%   BMI 30.31 kg/m  General: Well developed AA female in no acute distress Head: Normocephalic and atraumatic Eyes:  Sclerae anicteric, conjunctiva pink. Ears: Normal auditory acuity Lungs: Clear throughout to auscultation; no W/R/R. Heart: Regular rate and rhythm; no M/R/G. Abdomen: Soft, non-distended.  BS present.  Non-tender. Rectal:  Small non-flamed external hemorrhoids noted.  DRE did not reveal any  masses.  Anoscopy showed medium to large internal hemorrhoids with inflammation. Musculoskeletal: Symmetrical with no gross deformities  Skin: No lesions on visible extremities Extremities: No edema  Neurological: Alert oriented x 4, grossly non-focal Psychological:  Alert and cooperative. Normal mood and affect  ASSESSMENT AND PLAN: *Intermittent rectal bleeding: I am quite sure that her intermittent bleeding and perianal discomfort is due to the internal hemorrhoids seen on exam today.  I am going to prescribe hydrocortisone suppositories for her to use at  bedtime.  She would also like to be scheduled for internal hemorrhoid banding.  This was scheduled Dr. Hilarie Fredrickson.  This was discussed with her and she was given literature on this.  **Addendum: After my visit with her I found an office visit note from Dr. Zenia Resides from [place] who had referred her here for consideration of colonoscopy as she could not be adequately evaluated in their office due to her degree of discomfort that day.  As above, she did have moderate to large size internal hemorrhoids on exam today, which I am sure is the cause of her intermittent bleeding and perianal discomfort, but I am having my nurse reach out to her to see if she would prefer to proceed with colonoscopy and follow-up with [place] [facility].  She did not mention to me at her visit that she had been seen by them.   CC:  Biagio Borg, MD  CC:  Dr. Zenia Resides with [facility]

## 2020-11-28 NOTE — Telephone Encounter (Signed)
Left message on machine to call back  

## 2020-11-28 NOTE — Patient Instructions (Addendum)
If you are age 59 or older, your body mass index should be between 23-30. Your Body mass index is 30.31 kg/m. If this is out of the aforementioned range listed, please consider follow up with your Primary Care Provider.  If you are age 81 or younger, your body mass index should be between 19-25. Your Body mass index is 30.31 kg/m. If this is out of the aformentioned range listed, please consider follow up with your Primary Care Provider.   We have sent the following medications to your pharmacy for you to pick up at your convenience: Hydrocortisone suppository nightly for 7 days.  Follow up for hemorrhoid banding with Dr. Hilarie Fredrickson.   Hemorrhoid Banding CPT: [id number]

## 2020-11-28 NOTE — Telephone Encounter (Signed)
-----   Message from Loralie Champagne, PA-C sent at 11/28/2020  1:23 PM EDT ----- I saw this patient in clinic today.  After I saw her I saw a faxed note from [place] [facility].  They had recommended consideration for colonoscopy especially since she could not be examined appropriately during their visit due to discomfort.  I performed an anoscopy on her today and I saw medium to large size inflamed internal hemorrhoids, which I am sure is the cause of the bleeding that she has been describing.  It looks like she supposed to follow-up with them, but they said after colonoscopy is performed.  I gave her hydrocortisone suppositories to use and she was going to consider hemorrhoid banding in our office, which I think certainly is appropriate to start with, but if she would prefer to proceed with colonoscopy and then follow-up with [place] [facility] then I am fine with that as well.  Like I said, I did not see the office visit note when I saw her in clinic today and she did not bring it up to me that she had seen them.  Will you please speak with her about this.  Thank you,  Jess

## 2020-11-29 NOTE — Telephone Encounter (Signed)
Left message on machine to call back  

## 2020-11-30 NOTE — Telephone Encounter (Signed)
Unable to reach pt by phone letter mailed.  

## 2020-11-30 NOTE — Telephone Encounter (Signed)
Susan Mejia the pt wants to proceed with banding at this time and discuss colon if that does not work.  FYI

## 2020-11-30 NOTE — Telephone Encounter (Signed)
Letter mailed to have pt call to discuss recommendations

## 2020-11-30 NOTE — Telephone Encounter (Signed)
Ok. Thank you.

## 2020-12-14 NOTE — Progress Notes (Signed)
Addendum: Reviewed and agree with assessment and management plan. Following up to determine if patient planning for colonoscopy? Thanks  Belissa Kooy, Lajuan Lines, MD

## 2021-01-03 ENCOUNTER — Encounter: Payer: Self-pay | Admitting: *Deleted

## 2021-01-23 ENCOUNTER — Encounter: Payer: Self-pay | Admitting: Internal Medicine

## 2021-01-28 ENCOUNTER — Other Ambulatory Visit: Payer: Self-pay

## 2021-01-29 ENCOUNTER — Other Ambulatory Visit: Payer: Self-pay | Admitting: Internal Medicine

## 2021-01-29 ENCOUNTER — Ambulatory Visit (INDEPENDENT_AMBULATORY_CARE_PROVIDER_SITE_OTHER): Payer: BC Managed Care – PPO | Admitting: Internal Medicine

## 2021-01-29 ENCOUNTER — Encounter: Payer: Self-pay | Admitting: Internal Medicine

## 2021-01-29 ENCOUNTER — Ambulatory Visit (INDEPENDENT_AMBULATORY_CARE_PROVIDER_SITE_OTHER): Payer: BC Managed Care – PPO

## 2021-01-29 VITALS — BP 128/82 | HR 71 | Temp 98.3°F | Ht 61.0 in | Wt 165.2 lb

## 2021-01-29 DIAGNOSIS — E559 Vitamin D deficiency, unspecified: Secondary | ICD-10-CM

## 2021-01-29 DIAGNOSIS — R0789 Other chest pain: Secondary | ICD-10-CM | POA: Diagnosis not present

## 2021-01-29 DIAGNOSIS — H6123 Impacted cerumen, bilateral: Secondary | ICD-10-CM

## 2021-01-29 DIAGNOSIS — Z0001 Encounter for general adult medical examination with abnormal findings: Secondary | ICD-10-CM | POA: Diagnosis not present

## 2021-01-29 DIAGNOSIS — R079 Chest pain, unspecified: Secondary | ICD-10-CM | POA: Diagnosis not present

## 2021-01-29 DIAGNOSIS — R739 Hyperglycemia, unspecified: Secondary | ICD-10-CM | POA: Diagnosis not present

## 2021-01-29 DIAGNOSIS — I10 Essential (primary) hypertension: Secondary | ICD-10-CM

## 2021-01-29 DIAGNOSIS — E7800 Pure hypercholesterolemia, unspecified: Secondary | ICD-10-CM

## 2021-01-29 DIAGNOSIS — I700 Atherosclerosis of aorta: Secondary | ICD-10-CM

## 2021-01-29 DIAGNOSIS — Z00129 Encounter for routine child health examination without abnormal findings: Secondary | ICD-10-CM

## 2021-01-29 LAB — BASIC METABOLIC PANEL
BUN: 14 mg/dL (ref 6–23)
CO2: 28 mEq/L (ref 19–32)
Calcium: 9.8 mg/dL (ref 8.4–10.5)
Chloride: 103 mEq/L (ref 96–112)
Creatinine, Ser: 0.82 mg/dL (ref 0.40–1.20)
GFR: 78.74 mL/min (ref >60.00)
Glucose, Bld: 91 mg/dL (ref 70–99)
Potassium: 4 mEq/L (ref 3.5–5.1)
Sodium: 138 mEq/L (ref 135–145)

## 2021-01-29 LAB — CBC WITH DIFFERENTIAL/PLATELET
Basophils Absolute: 0 10*3/uL (ref 0.0–0.1)
Basophils Relative: 0.4 % (ref 0.0–3.0)
Eosinophils Absolute: 0.1 10*3/uL (ref 0.0–0.7)
Eosinophils Relative: 0.8 % (ref 0.0–5.0)
HCT: 39.8 % (ref 36.0–46.0)
Hemoglobin: 13.3 g/dL (ref 12.0–15.0)
Lymphocytes Relative: 40.5 % (ref 12.0–46.0)
Lymphs Abs: 2.7 10*3/uL (ref 0.7–4.0)
MCHC: 33.3 g/dL (ref 30.0–36.0)
MCV: 87.7 fl (ref 78.0–100.0)
Monocytes Absolute: 0.5 10*3/uL (ref 0.1–1.0)
Monocytes Relative: 6.9 % (ref 3.0–12.0)
Neutro Abs: 3.4 10*3/uL (ref 1.4–7.7)
Neutrophils Relative %: 51.4 % (ref 43.0–77.0)
Platelets: 224 10*3/uL (ref 150.0–400.0)
RBC: 4.54 Mil/uL (ref 3.87–5.11)
RDW: 14.2 % (ref 11.5–15.5)
WBC: 6.6 10*3/uL (ref 4.0–10.5)

## 2021-01-29 LAB — URINALYSIS, ROUTINE W REFLEX MICROSCOPIC
Bilirubin Urine: NEGATIVE
Hgb urine dipstick: NEGATIVE
Ketones, ur: NEGATIVE
Nitrite: NEGATIVE
Specific Gravity, Urine: >=1.03 — AB (ref 1.000–1.030)
Total Protein, Urine: NEGATIVE
Urine Glucose: NEGATIVE
Urobilinogen, UA: 0.2 (ref 0.0–1.0)
pH: 5.5 (ref 5.0–8.0)

## 2021-01-29 LAB — HEPATIC FUNCTION PANEL
ALT: 16 U/L (ref 0–35)
AST: 18 U/L (ref 0–37)
Albumin: 4.6 g/dL (ref 3.5–5.2)
Alkaline Phosphatase: 80 U/L (ref 39–117)
Bilirubin, Direct: 0.1 mg/dL (ref 0.0–0.3)
Total Bilirubin: 0.5 mg/dL (ref 0.2–1.2)
Total Protein: 7.6 g/dL (ref 6.0–8.3)

## 2021-01-29 LAB — TSH: TSH: 0.88 u[IU]/mL (ref 0.35–5.50)

## 2021-01-29 LAB — LIPID PANEL
Cholesterol: 189 mg/dL (ref 0–200)
HDL: 52.6 mg/dL (ref >39.00)
LDL Cholesterol: 119 mg/dL — ABNORMAL HIGH (ref 0–99)
NonHDL: 136.64
Total CHOL/HDL Ratio: 4
Triglycerides: 89 mg/dL (ref 0.0–149.0)
VLDL: 17.8 mg/dL (ref 0.0–40.0)

## 2021-01-29 LAB — HEMOGLOBIN A1C: Hgb A1c MFr Bld: 6.1 % (ref 4.6–6.5)

## 2021-01-29 LAB — VITAMIN D 25 HYDROXY (VIT D DEFICIENCY, FRACTURES): VITD: 33.4 ng/mL (ref 30.00–100.00)

## 2021-01-29 MED ORDER — PHENTERMINE HCL 37.5 MG PO CAPS: 37.5000 mg | ORAL_CAPSULE | Freq: Every morning | ORAL | 2 refills | Status: DC

## 2021-01-29 MED ORDER — ATORVASTATIN CALCIUM 20 MG PO TABS: 20.0000 mg | ORAL_TABLET | Freq: Every day | ORAL | 3 refills | Status: DC

## 2021-01-29 MED ORDER — ATORVASTATIN CALCIUM 40 MG PO TABS: 40.0000 mg | ORAL_TABLET | Freq: Every day | ORAL | 3 refills | Status: DC

## 2021-01-29 NOTE — Assessment & Plan Note (Signed)
For contd lipitor, exercise, diet

## 2021-01-29 NOTE — Patient Instructions (Addendum)
Your EKG was [facility] today  Your ears were irrigated of wax today  Please continue all other medications as before, and refills have been done if requested.  Please have the pharmacy call with any other refills you may need.  Please continue your efforts at being more active, low cholesterol diet, and weight control.  You are otherwise up to date with prevention measures today.  Please keep your appointments with your specialists as you may have planned  Please go to the XRAY Department in the first floor for the x-ray testing  You will be contacted regarding the referral for: Stress testing  Please go to the LAB at the blood drawing area for the tests to be done  You will be contacted by phone if any changes need to be made immediately.  Otherwise, you will receive a letter about your results with an explanation, but please check with MyChart first.  Please remember to sign up for MyChart if you have not done so, as this will be important to you in the future with finding out test results, communicating by private email, and scheduling acute appointments online when needed.  Please make an Appointment to return in 6 months, or sooner if needed

## 2021-01-29 NOTE — Assessment & Plan Note (Signed)
Uncontrolled , goal ldl < 70, for incrase lipitor 20

## 2021-01-29 NOTE — Assessment & Plan Note (Signed)
Last vitamin D Lab Results  Component Value Date   VD25OH 31 01/27/2020   Low normal , to start oral replacement Vit D3 2000 u qd

## 2021-01-29 NOTE — Progress Notes (Signed)
Patient ID: Susan Mejia, female   DOB: 23-Dec-1960, 60 y.o.   MRN: [id number]         Chief Complaint:: wellness exam and aortic atherosclerosis, bilateral hearing loss, chest pain, htn, hld, hyperglycemia, vit d deficiency       HPI:  Susan Mejia is a 60 y.o. female here for wellness exam; declines covid booster, o/w up to date with preventive referrals and immunizations                        Also retired x 3 yrs from state of [place] employ x 30 yrs, now working part time at [employer].   Only taking Vit D "prn, and not always taking other rx med every single day, but most days.  Has had bilateral hearing loss worse in the past week, without HA, sinus symptoms, vertigo, tinnitus or ear pain.  Trying to follow lower chol diet.   Pt denies polydipsia, polyuria, or new focal neuro s/s.  Also c/o mild sharp intermittent left CP for 1-2 mo, somewhat increased freq and severity, assoc with mild sob, but no radiation, diaphoresis, n/v, palps or dizziness. No fever, cough.  Pain is nonexertional, nonpositional, nonpleuritic  Nothing seems to make better or worse.  No other new complaints Wt Readings from Last 3 Encounters:  01/29/21 165 lb 3.2 oz (74.9 kg)  11/28/20 160 lb 6.4 oz (72.8 kg)  01/27/20 177 lb (80.3 kg)   BP Readings from Last 3 Encounters:  01/29/21 128/82  11/28/20 124/82  01/27/20 116/82   Immunization History  Administered Date(s) Administered   Influenza,inj,Quad PF,6+ Mos 04/23/2019, 05/23/2020   Moderna Sars-Covid-2 Vaccination 10/01/2019, 11/02/2019, 05/01/2020   Zoster Recombinat (Shingrix) 09/12/2019, 12/13/2019   There are no preventive care reminders to display for this patient.     Past Medical History:  Diagnosis Date   Diverticulosis    Essential hypertension, benign 11/17/2013   Frequent headaches    Gallstones    GERD (gastroesophageal reflux disease)    Hyperlipidemia    diet controlled   Internal hemorrhoids    Obesity    Thyroid disease     hyperactive after pregancy, normal now, no meds   Past Surgical History:  Procedure Laterality Date   CHOLECYSTECTOMY     TONSILLECTOMY      reports that she has never smoked. She has never used smokeless tobacco. She reports that she does not drink alcohol and does not use drugs. family history includes Cancer in her father; Diabetes in an other family member; Heart disease in her mother; Hypertension in her mother; Lung cancer in her father. No Known Allergies Current Outpatient Medications on File Prior to Visit  Medication Sig Dispense Refill   aspirin EC 81 MG tablet Take 1 tablet (81 mg total) by mouth daily. 90 tablet 11   Biotin 10000 MCG TABS biotin     diphenhydramine-acetaminophen (TYLENOL PM) 25-500 MG TABS tablet Take 1 tablet by mouth at bedtime as needed (for sleep).     Fluocinolone Acetonide Scalp 0.01 % OIL Apply topically.     fluticasone (CUTIVATE) 0.05 % cream fluticasone propionate 0.05 % topical cream  APPLY TWICE DAILY TO SPOTS ON FACE 1 WEEK ON, 1 WEEK OFF, REPEAT     hydrocortisone (ANUSOL-HC) 25 MG suppository Place 1 suppository (25 mg total) rectally at bedtime. 7 suppository 2   irbesartan-hydrochlorothiazide (AVALIDE) 150-12.5 MG tablet Take 1 tablet by mouth daily. 90 tablet 3  latanoprost (XALATAN) 0.005 % ophthalmic solution Place 1 drop into both eyes nightly.     levocetirizine (XYZAL) 5 MG tablet SMARTSIG:1 Tablet(s) By Mouth Every Evening PRN     pantoprazole (PROTONIX) 40 MG tablet Take 1 tablet (40 mg total) by mouth daily as needed. 90 tablet 3   trimethoprim-polymyxin b (POLYTRIM) ophthalmic solution 1 drop every 3 (three) hours.     No current facility-administered medications on file prior to visit.        ROS:  All others reviewed and negative.  Objective        PE:  BP 128/82 (BP Location: Left Arm, Patient Position: Sitting, Cuff Size: Large)   Pulse 71   Temp 98.3 F (36.8 C) (Oral)   Ht 5\' 1"  (1.549 m)   Wt 165 lb 3.2 oz (74.9 kg)    SpO2 99%   BMI 31.21 kg/m                 Constitutional: Pt appears in NAD               HENT: Head: NCAT.                Right Ear: External ear normal.                 Left Ear: External ear normal.                Eyes: . Pupils are equal, round, and reactive to light. Conjunctivae and EOM are normal               Nose: without d/c or deformity               Neck: Neck supple. Gross normal ROM               Cardiovascular: Normal rate and regular rhythm.                 Pulmonary/Chest: Effort normal and breath sounds without rales or wheezing.                Abd:  Soft, NT, ND, + BS, no organomegaly               Neurological: Pt is alert. At baseline orientation, motor grossly intact               Skin: Skin is warm. No rashes, no other new lesions, LE edema - none               Psychiatric: Pt behavior is normal without agitation   Micro: none  Cardiac tracings I have personally interpreted today:  ECG - NSR 60 - no ischemic changes  Pertinent Radiological findings (summarize): none   Lab Results  Component Value Date   WBC 6.6 01/29/2021   HGB 13.3 01/29/2021   HCT 39.8 01/29/2021   PLT 224.0 01/29/2021   GLUCOSE 91 01/29/2021   CHOL 189 01/29/2021   TRIG 89.0 01/29/2021   HDL 52.60 01/29/2021   LDLDIRECT 158.0 05/03/2015   LDLCALC 119 (H) 01/29/2021   ALT 16 01/29/2021   AST 18 01/29/2021   NA 138 01/29/2021   K 4.0 01/29/2021   CL 103 01/29/2021   CREATININE 0.82 01/29/2021   BUN 14 01/29/2021   CO2 28 01/29/2021   TSH 0.88 01/29/2021   HGBA1C 6.1 01/29/2021   Assessment/Plan:  Susan Mejia is a 60 y.o. Black or African American [2] female with  has  a past medical history of Diverticulosis, Essential hypertension, benign (11/17/2013), Frequent headaches, Gallstones, GERD (gastroesophageal reflux disease), Hyperlipidemia, Internal hemorrhoids, Obesity, and Thyroid disease.  Vitamin D deficiency Last vitamin D Lab Results  Component Value Date    VD25OH 31 01/27/2020   Low normal , to start oral replacement Vit D3 2000 u qd  Hypercholesterolemia Uncontrolled , goal ldl < 70, for incrase lipitor 20  Aortic atherosclerosis (HCC) For contd lipitor, exercise, diet  Encounter for well adolescent visit without abnormal findings Age and sex appropriate education and counseling updated with regular exercise and diet Referrals for preventative services - none needed Immunizations addressed - declines covid booster Smoking counseling  - none needed Evidence for depression or other mood disorder - none significant Most recent labs reviewed. I have personally reviewed and have noted: 1) the patient's medical and social history 2) The patient's current medications and supplements 3) The patient's height, weight, and BMI have been recorded in the chart   Bilateral hearing loss due to cerumen impaction Patient agreed to ear lavage due to bilateral cerumen impaction; Ear lavage completed with no difficulty, impaction resolved, TM's look normal; she will continue using OTC kits regularly for prevention  Hearing improved with irrigation wax impactions.  Chest pain Atypical, etiology unclear, ecg reviewed, for cxr, and cardiac stress testing referral  Hyperglycemia Lab Results  Component Value Date   HGBA1C 6.1 01/29/2021   Stable, pt to continue current medical treatment  - diet   Essential hypertension, benign BP Readings from Last 3 Encounters:  01/29/21 128/82  11/28/20 124/82  01/27/20 116/82   Stable, pt to continue medical treatment [place]  Followup: Return in about 6 months (around 08/01/2021).  Cathlean Cower, MD 02/03/2021 1:54 PM [facility]

## 2021-01-30 ENCOUNTER — Encounter: Payer: Self-pay | Admitting: Internal Medicine

## 2021-02-03 ENCOUNTER — Encounter: Payer: Self-pay | Admitting: Internal Medicine

## 2021-02-03 NOTE — Assessment & Plan Note (Signed)
Patient agreed to ear lavage due to bilateral cerumen impaction; Ear lavage completed with no difficulty, impaction resolved, TM's look normal; she will continue using OTC kits regularly for prevention  Hearing improved with irrigation wax impactions.

## 2021-02-03 NOTE — Assessment & Plan Note (Signed)

## 2021-02-03 NOTE — Assessment & Plan Note (Addendum)
Atypical, etiology unclear, ecg reviewed, for cxr, and cardiac stress testing referral

## 2021-02-03 NOTE — Assessment & Plan Note (Signed)
Lab Results  Component Value Date   HGBA1C 6.1 01/29/2021   Stable, pt to continue current medical treatment  - diet

## 2021-02-03 NOTE — Assessment & Plan Note (Signed)
BP Readings from Last 3 Encounters:  01/29/21 128/82  11/28/20 124/82  01/27/20 116/82   Stable, pt to continue medical treatment [place]

## 2021-02-13 ENCOUNTER — Telehealth (HOSPITAL_COMMUNITY): Payer: Self-pay

## 2021-02-13 NOTE — Telephone Encounter (Signed)
Detailed instructions left on the patient's answering machine. Asked to call back with any questions. S.Yosiel Thieme EMTP 

## 2021-02-19 ENCOUNTER — Encounter: Payer: Self-pay | Admitting: Internal Medicine

## 2021-02-19 ENCOUNTER — Ambulatory Visit (HOSPITAL_COMMUNITY): Payer: BC Managed Care – PPO | Attending: Cardiovascular Disease

## 2021-02-19 ENCOUNTER — Other Ambulatory Visit: Payer: Self-pay

## 2021-02-19 DIAGNOSIS — R0789 Other chest pain: Secondary | ICD-10-CM | POA: Diagnosis present

## 2021-02-19 LAB — MYOCARDIAL PERFUSION IMAGING
Estimated workload: 8.6 METS
Exercise duration (min): 7 min
Exercise duration (sec): 41 s
LV dias vol: 49 mL (ref 46–106)
LV sys vol: 18 mL
MPHR: 162 {beats}/min
Peak HR: 142 {beats}/min
Percent HR: 87 %
Rest HR: 67 {beats}/min
SDS: 2
SRS: 0
SSS: 2
TID: 0.91

## 2021-02-19 MED ORDER — TECHNETIUM TC 99M TETROFOSMIN IV KIT
10.7000 | PACK | Freq: Once | INTRAVENOUS | Status: AC | PRN
Start: 2021-02-19 — End: 2021-02-19
  Administered 2021-02-19: 10.7 via INTRAVENOUS
  Filled 2021-02-19: qty 11

## 2021-02-19 MED ORDER — TECHNETIUM TC 99M TETROFOSMIN IV KIT
31.1000 | PACK | Freq: Once | INTRAVENOUS | Status: AC | PRN
  Administered 2021-02-19: 31.1 via INTRAVENOUS
  Filled 2021-02-19: qty 32

## 2021-09-11 ENCOUNTER — Other Ambulatory Visit: Payer: Self-pay | Admitting: Internal Medicine

## 2021-11-18 ENCOUNTER — Ambulatory Visit: Payer: BC Managed Care – PPO | Admitting: Emergency Medicine

## 2021-11-18 ENCOUNTER — Encounter: Payer: Self-pay | Admitting: Emergency Medicine

## 2021-11-18 VITALS — BP 136/84 | HR 73 | Temp 98.3°F | Ht 61.0 in | Wt 164.4 lb

## 2021-11-18 DIAGNOSIS — J22 Unspecified acute lower respiratory infection: Secondary | ICD-10-CM | POA: Diagnosis not present

## 2021-11-18 DIAGNOSIS — R051 Acute cough: Secondary | ICD-10-CM | POA: Diagnosis not present

## 2021-11-18 MED ORDER — HYDROCODONE BIT-HOMATROP MBR 5-1.5 MG/5ML PO SOLN: 5.0000 mL | Freq: Every evening | ORAL | 0 refills | Status: DC | PRN

## 2021-11-18 MED ORDER — AZITHROMYCIN 250 MG PO TABS: ORAL_TABLET | ORAL | 0 refills | Status: DC

## 2021-11-18 MED ORDER — BENZONATATE 200 MG PO CAPS: 200.0000 mg | ORAL_CAPSULE | Freq: Two times a day (BID) | ORAL | 0 refills | Status: DC | PRN

## 2021-11-18 NOTE — Patient Instructions (Signed)
  Acute Bronchitis, Adult  Acute bronchitis is when air tubes in the lungs (bronchi) suddenly get swollen. The condition can make it hard for you to breathe. In adults, acute bronchitis usually goes away within 2 weeks. A cough caused by bronchitis may last up to 3 weeks. Smoking, allergies, and asthma can make the condition worse. What are the causes? Germs that cause cold and flu (viruses). The most common cause of this condition is the virus that causes the common cold. Bacteria. Substances that bother (irritate) the lungs, including: Smoke from cigarettes and other types of tobacco. Dust and pollen. Fumes from chemicals, gases, or burned fuel. Indoor or outdoor air pollution. What increases the risk? A weak body's defense system. This is also called the immune system. Any condition that affects your lungs and breathing, such as asthma. What are the signs or symptoms? A cough. Coughing up clear, yellow, or green mucus. Making high-pitched whistling sounds when you breathe, most often when you breathe out (wheezing). Runny or stuffy nose. Having too much mucus in your lungs (chest congestion). Shortness of breath. Body aches. A sore throat. How is this treated? Acute bronchitis may go away over time without treatment. Your doctor may tell you to: Drink more fluids. This will help thin your mucus so it is easier to cough up. Use a device that gets medicine into your lungs (inhaler). Use a vaporizer or a humidifier. These are machines that add water to the air. This helps with coughing and poor breathing. Take a medicine that thins mucus and helps clear it from your lungs. Take a medicine that prevents or stops coughing. It is not common to take an antibiotic medicine for this condition. Follow these instructions at home:  Take over-the-counter and prescription medicines only as told by your doctor. Use an inhaler, vaporizer, or humidifier as told by your doctor. Take two  teaspoons (10 mL) of honey at bedtime. This helps lessen your coughing at night. Drink enough fluid to keep your pee (urine) pale yellow. Do not smoke or use any products that contain nicotine or tobacco. If you need help quitting, ask your doctor. Get a lot of rest. Return to your normal activities when your doctor says that it is safe. Keep all follow-up visits. How is this prevented?  Wash your hands often with soap and water for at least 20 seconds. If you cannot use soap and water, use hand sanitizer. Avoid contact with people who have cold symptoms. Try not to touch your mouth, nose, or eyes with your hands. Avoid breathing in smoke or chemical fumes. Make sure to get the flu shot every year. Contact a doctor if: Your symptoms do not get better in 2 weeks. You have trouble coughing up the mucus. Your cough keeps you awake at night. You have a fever. Get help right away if: You cough up blood. You have chest pain. You have very bad shortness of breath. You faint or keep feeling like you are going to faint. You have a very bad headache. Your fever or chills get worse. These symptoms may be an emergency. Get help right away. Call your local emergency services (911 in the [place] Do not wait to see if the symptoms will go away. Do not drive yourself to the hospital. Summary Acute bronchitis is when air tubes in the lungs (bronchi) suddenly get swollen. In adults, acute bronchitis usually goes away within 2 weeks. Drink more fluids. This will help thin your mucus so it   is easier to cough up. Take over-the-counter and prescription medicines only as told by your doctor. Contact a doctor if your symptoms do not improve after 2 weeks of treatment. This information is not intended to replace advice given to you by your health care provider. Make sure you discuss any questions you have with your health care provider. Document Revised: 11/07/2020 Document Reviewed: 11/07/2020 Elsevier  Patient Education  2023 Elsevier Inc.  

## 2021-11-18 NOTE — Progress Notes (Signed)
Susan Mejia ?61 y.o. ? ? ?Chief Complaint  ?Patient presents with  ? Cough  ?  X 1 week   ? rattling in chest   ? ? ?HISTORY OF PRESENT ILLNESS: ?This is a 61 y.o. female complaining of productive cough for 1 week. ?Had bout of acute bronchitis 1 month ago.  Went to urgent care center and was given corticosteroid and bronchodilator inhaler.  Has been taking over-the-counter cold medication. ?Denies fever or chills.  Denies trouble breathing.  Occasional wheezing and "rattling in my chest". ?No other complaints or medical concerns today. ? ?Cough ?Pertinent negatives include no chest pain, chills, fever, headaches, rash or shortness of breath.  ? ? ?Prior to Admission medications   ?Medication Sig Start Date End Date Taking? Authorizing Provider  ?atorvastatin (LIPITOR) 40 MG tablet Take 1 tablet (40 mg total) by mouth daily. 01/29/21  Yes Biagio Borg, MD  ?irbesartan-hydrochlorothiazide (AVALIDE) 150-12.5 MG tablet Take 1 tablet by mouth daily. 01/27/20  Yes Biagio Borg, MD  ?aspirin EC 81 MG tablet Take 1 tablet (81 mg total) by mouth daily. ?Patient not taking: Reported on 11/18/2021 05/04/15   Biagio Borg, MD  ?Biotin 10000 MCG TABS biotin ?Patient not taking: Reported on 11/18/2021    [provider]  ?diphenhydramine-acetaminophen (TYLENOL PM) 25-500 MG TABS tablet Take 1 tablet by mouth at bedtime as needed (for sleep). ?Patient not taking: Reported on 11/18/2021    [provider]  ?Fluocinolone Acetonide Scalp 0.01 % OIL Apply topically. ?Patient not taking: Reported on 11/18/2021 11/30/20   [provider]  ?fluticasone (CUTIVATE) 0.05 % cream fluticasone propionate 0.05 % topical cream ? APPLY TWICE DAILY TO SPOTS ON FACE 1 WEEK ON, 1 WEEK OFF, REPEAT ?Patient not taking: Reported on 11/18/2021    [provider]  ?hydrocortisone (ANUSOL-HC) 25 MG suppository Place 1 suppository (25 mg total) rectally at bedtime. ?Patient not taking: Reported on 11/18/2021 11/28/20   Zehr,  Laban Emperor, PA-C  ?latanoprost (XALATAN) 0.005 % ophthalmic solution Place 1 drop into both eyes nightly. ?Patient not taking: Reported on 11/18/2021 01/25/20   [provider]  ?levocetirizine (XYZAL) 5 MG tablet SMARTSIG:1 Tablet(s) By Mouth Every Evening PRN ?Patient not taking: Reported on 11/18/2021 11/30/20   [provider]  ?pantoprazole (PROTONIX) 40 MG tablet Take 1 tablet (40 mg total) by mouth daily as needed. ?Patient not taking: Reported on 11/18/2021 01/27/20   Biagio Borg, MD  ?phentermine 37.5 MG capsule Take 1 capsule by mouth in the morning ?Patient not taking: Reported on 11/18/2021 09/11/21   Biagio Borg, MD  ?trimethoprim-polymyxin b [facility]) ophthalmic solution 1 drop every 3 (three) hours. ?Patient not taking: Reported on 11/18/2021 10/24/19   [provider]  ? ? ?No Known Allergies ? ?Patient Active Problem List  ? Diagnosis Date Noted  ? Bilateral hearing loss due to cerumen impaction 01/29/2021  ? Aortic atherosclerosis ([place]) 01/29/2021  ? Rectal bleeding 11/28/2020  ? Internal hemorrhoids 11/28/2020  ? Hyperglycemia 01/27/2020  ? Vitamin D deficiency 01/27/2020  ? Hypercholesterolemia 08/30/2019  ? Bilateral hearing loss 01/29/2019  ? GERD (gastroesophageal reflux disease) 08/08/2016  ? Acute sinus infection 06/02/2015  ? Migraine headache 04/28/2014  ? Insomnia 12/30/2013  ? Essential hypertension, benign 11/17/2013  ? Encounter for well adolescent visit without abnormal findings 11/17/2013  ? Chest pain 11/17/2013  ? UNSPECIFIED SINUSITIS 08/08/2009  ? AMENORRHEA 11/22/2008  ? VERTIGO 11/22/2008  ? Hyperlipidemia 03/07/2007  ? Morbid obesity ([place])  03/07/2007  ? Depression 03/07/2007  ? ALLERGIC RHINITIS 03/07/2007  ? ? ?Past Medical History:  ?Diagnosis Date  ? Diverticulosis   ? Essential hypertension, benign 11/17/2013  ? Frequent headaches   ? Gallstones   ? GERD (gastroesophageal reflux disease)   ? Hyperlipidemia   ? diet controlled  ? Internal hemorrhoids   ?  Obesity   ? Thyroid disease   ? hyperactive after pregancy, normal now, no meds  ? ? ?Past Surgical History:  ?Procedure Laterality Date  ? CHOLECYSTECTOMY    ? TONSILLECTOMY    ? ? ?Social History  ? ?Socioeconomic History  ? Marital status: Single  ?  Spouse name: Not on file  ? Number of children: 1  ? Years of education: 4  ? Highest education level: Not on file  ?Occupational History  ? Occupation: Surveyor, quantity  ?  Employer: OTHER  ?Tobacco Use  ? Smoking status: Never  ? Smokeless tobacco: Never  ?Substance and Sexual Activity  ? Alcohol use: No  ? Drug use: No  ? Sexual activity: Not on file  ?Other Topics Concern  ? Not on file  ?Social History Narrative  ? single  ? ?Social Determinants of Health  ? ?Financial Resource Strain: Not on file  ?Food Insecurity: Not on file  ?Transportation Needs: Not on file  ?Physical Activity: Not on file  ?Stress: Not on file  ?Social Connections: Not on file  ?Intimate Partner Violence: Not on file  ? ? ?Family History  ?Problem Relation Age of Onset  ? Lung cancer Father   ? Cancer Father   ?     lung cancer  ? Heart disease Mother   ? Hypertension Mother   ? Diabetes Other   ? Colon cancer Neg Hx   ? Esophageal cancer Neg Hx   ? Rectal cancer Neg Hx   ? Stomach cancer Neg Hx   ? ? ? ?Review of Systems  ?Constitutional: Negative.  Negative for chills and fever.  ?HENT: Negative.    ?Respiratory:  Positive for cough and sputum production. Negative for shortness of breath.   ?Cardiovascular: Negative.  Negative for chest pain and palpitations.  ?Gastrointestinal:  Negative for abdominal pain, diarrhea, nausea and vomiting.  ?Genitourinary: Negative.  Negative for dysuria.  ?Skin: Negative.  Negative for rash.  ?Neurological: Negative.  Negative for dizziness and headaches.  ?All other systems reviewed and are negative. ? ?Today's Vitals  ? 11/18/21 1339  ?BP: 136/84  ?Pulse: 73  ?Temp: 98.3 ?F (36.8 ?C)  ?TempSrc: Oral  ?SpO2: 96%  ?Weight: 164 lb 6  oz (74.6 kg)  ?Height: '5\' 1"'$  (1.549 m)  ? ?Body mass index is 31.06 kg/m?. ? ?Physical Exam ?Vitals reviewed.  ?Constitutional:   ?   Appearance: Normal appearance.  ?HENT:  ?   Head: Normocephalic.  ?   Right Ear: Tympanic membrane, ear canal and external ear normal.  ?   Left Ear: Tympanic membrane, ear canal and external ear normal.  ?   Mouth/Throat:  ?   Mouth: Mucous membranes are moist.  ?   Pharynx: Oropharynx is clear.  ?Eyes:  ?   Extraocular Movements: Extraocular movements intact.  ?   Conjunctiva/sclera: Conjunctivae normal.  ?   Pupils: Pupils are equal, round, and reactive to light.  ?Cardiovascular:  ?   Rate and Rhythm: Normal rate and regular rhythm.  ?Pulmonary:  ?   Effort: Pulmonary effort is normal.  ?   Breath  sounds: Normal breath sounds. No rhonchi or rales.  ?Musculoskeletal:     ?   General: Normal range of motion.  ?   Cervical back: No tenderness.  ?Lymphadenopathy:  ?   Cervical: No cervical adenopathy.  ?Skin: ?   General: Skin is warm and dry.  ?   Capillary Refill: Capillary refill takes less than 2 seconds.  ?Neurological:  ?   General: No focal deficit present.  ?   Mental Status: She is alert and oriented to person, place, and time.  ?Psychiatric:     ?   Mood and Affect: Mood normal.     ?   Behavior: Behavior normal.  ? ? ? ?ASSESSMENT & PLAN: ?Clinically stable.  No red flag signs or symptoms.  No signs of pneumonia. ?Viral upper respiratory infection now with secondary bacterial infection after several weeks. ?Will benefit from azithromycin for 5 days. ?Cough medication with Tessalon, over-the-counter Mucinex DM, and Hycodan as needed. ?Advised to rest and stay well-hydrated. ?Contact the office if no better or worse during the next several days. ? ?Problem List Items Addressed This Visit   ?None ?Visit Diagnoses   ? ? Lower respiratory infection    -  Primary  ? Relevant Medications  ? azithromycin (ZITHROMAX) 250 MG tablet  ? Acute cough      ? Relevant Medications  ?  benzonatate (TESSALON) 200 MG capsule  ? HYDROcodone bit-homatropine (HYCODAN) 5-1.5 MG/5ML syrup  ? ?  ? ?Patient Instructions  ?Acute Bronchitis, Adult ? ?Acute bronchitis is when air tubes in the lungs (bronchi)

## 2022-03-23 ENCOUNTER — Other Ambulatory Visit: Payer: Self-pay | Admitting: Internal Medicine

## 2022-04-09 ENCOUNTER — Ambulatory Visit (INDEPENDENT_AMBULATORY_CARE_PROVIDER_SITE_OTHER): Payer: BC Managed Care – PPO | Admitting: Internal Medicine

## 2022-04-09 VITALS — BP 130/78 | HR 95 | Temp 98.3°F | Ht 61.0 in | Wt 166.0 lb

## 2022-04-09 DIAGNOSIS — Z00129 Encounter for routine child health examination without abnormal findings: Secondary | ICD-10-CM

## 2022-04-09 DIAGNOSIS — Z23 Encounter for immunization: Secondary | ICD-10-CM | POA: Diagnosis not present

## 2022-04-09 DIAGNOSIS — R739 Hyperglycemia, unspecified: Secondary | ICD-10-CM | POA: Diagnosis not present

## 2022-04-09 DIAGNOSIS — I700 Atherosclerosis of aorta: Secondary | ICD-10-CM

## 2022-04-09 DIAGNOSIS — E559 Vitamin D deficiency, unspecified: Secondary | ICD-10-CM | POA: Diagnosis not present

## 2022-04-09 DIAGNOSIS — H6123 Impacted cerumen, bilateral: Secondary | ICD-10-CM

## 2022-04-09 DIAGNOSIS — Z0000 Encounter for general adult medical examination without abnormal findings: Secondary | ICD-10-CM

## 2022-04-09 DIAGNOSIS — E538 Deficiency of other specified B group vitamins: Secondary | ICD-10-CM | POA: Diagnosis not present

## 2022-04-09 DIAGNOSIS — K921 Melena: Secondary | ICD-10-CM

## 2022-04-09 DIAGNOSIS — E7800 Pure hypercholesterolemia, unspecified: Secondary | ICD-10-CM | POA: Diagnosis not present

## 2022-04-09 DIAGNOSIS — K648 Other hemorrhoids: Secondary | ICD-10-CM | POA: Diagnosis not present

## 2022-04-09 DIAGNOSIS — I10 Essential (primary) hypertension: Secondary | ICD-10-CM

## 2022-04-09 LAB — MICROALBUMIN / CREATININE URINE RATIO
Creatinine,U: 95.2 mg/dL
Microalb Creat Ratio: 2.7 mg/g (ref 0.0–30.0)
Microalb, Ur: 2.5 mg/dL — ABNORMAL HIGH (ref 0.0–1.9)

## 2022-04-09 LAB — BASIC METABOLIC PANEL
BUN: 18 mg/dL (ref 6–23)
CO2: 27 mEq/L (ref 19–32)
Calcium: 10.1 mg/dL (ref 8.4–10.5)
Chloride: 101 mEq/L (ref 96–112)
Creatinine, Ser: 1.06 mg/dL (ref 0.40–1.20)
GFR: 57.38 mL/min — ABNORMAL LOW (ref >60.00)
Glucose, Bld: 87 mg/dL (ref 70–99)
Potassium: 4.1 mEq/L (ref 3.5–5.1)
Sodium: 138 mEq/L (ref 135–145)

## 2022-04-09 LAB — CBC WITH DIFFERENTIAL/PLATELET
Basophils Absolute: 0.1 10*3/uL (ref 0.0–0.1)
Basophils Relative: 0.9 % (ref 0.0–3.0)
Eosinophils Absolute: 0 10*3/uL (ref 0.0–0.7)
Eosinophils Relative: 0.2 % (ref 0.0–5.0)
HCT: 40.9 % (ref 36.0–46.0)
Hemoglobin: 13.3 g/dL (ref 12.0–15.0)
Lymphocytes Relative: 32.3 % (ref 12.0–46.0)
Lymphs Abs: 2.9 10*3/uL (ref 0.7–4.0)
MCHC: 32.4 g/dL (ref 30.0–36.0)
MCV: 89.2 fl (ref 78.0–100.0)
Monocytes Absolute: 0.6 10*3/uL (ref 0.1–1.0)
Monocytes Relative: 6.3 % (ref 3.0–12.0)
Neutro Abs: 5.4 10*3/uL (ref 1.4–7.7)
Neutrophils Relative %: 60.3 % (ref 43.0–77.0)
Platelets: 221 10*3/uL (ref 150.0–400.0)
RBC: 4.59 Mil/uL (ref 3.87–5.11)
RDW: 14.4 % (ref 11.5–15.5)
WBC: 8.9 10*3/uL (ref 4.0–10.5)

## 2022-04-09 LAB — LIPID PANEL
Cholesterol: 174 mg/dL (ref 0–200)
HDL: 56.7 mg/dL (ref >39.00)
LDL Cholesterol: 97 mg/dL (ref 0–99)
NonHDL: 117.14
Total CHOL/HDL Ratio: 3
Triglycerides: 102 mg/dL (ref 0.0–149.0)
VLDL: 20.4 mg/dL (ref 0.0–40.0)

## 2022-04-09 LAB — VITAMIN B12: Vitamin B-12: 393 pg/mL (ref 211–911)

## 2022-04-09 LAB — HEPATIC FUNCTION PANEL
ALT: 15 U/L (ref 0–35)
AST: 18 U/L (ref 0–37)
Albumin: 4.6 g/dL (ref 3.5–5.2)
Alkaline Phosphatase: 78 U/L (ref 39–117)
Bilirubin, Direct: 0.1 mg/dL (ref 0.0–0.3)
Total Bilirubin: 0.4 mg/dL (ref 0.2–1.2)
Total Protein: 8.2 g/dL (ref 6.0–8.3)

## 2022-04-09 LAB — TSH: TSH: 0.86 u[IU]/mL (ref 0.35–5.50)

## 2022-04-09 NOTE — Assessment & Plan Note (Signed)
Last vitamin D Lab Results  Component Value Date   VD25OH 33.40 01/29/2021   Low, asked to increase oral replacement

## 2022-04-09 NOTE — Progress Notes (Signed)
Patient ID: Susan Mejia, female   DOB: [birth year], 61 y.o.   MRN: [id number]         Chief Complaint:: wellness exam and hld,  low vit d, hemorrhoids, hematochezia, fatty liver, aortic atherosclerosis       HPI:  Susan Mejia is a 61 y.o. female here for wellness exam; due for flu shot, o/w up to date                 Also tolerating higher dose lipitor, now taking low dose Vit D 1000 u..  Has fatty liver on imaging and has gained wt over the past several yrs.  Also has c/o rather dramatic internal hemorrhoid no longer able to be retracted, with mild intermttent small volume bleeding.  Asks for GI referral.  Pt denies chest pain, increased sob or doe, wheezing, orthopnea, PND, increased LE swelling, palpitations, dizziness or syncope.   Pt denies polydipsia, polyuria, or new focal neuro s/s.   Also has both ears with wax impaction again with muffled hearing.    Wt Readings from Last 3 Encounters:  04/09/22 166 lb (75.3 kg)  11/18/21 164 lb 6 oz (74.6 kg)  02/19/21 165 lb (74.8 kg)   BP Readings from Last 3 Encounters:  04/09/22 130/78  11/18/21 136/84  01/29/21 128/82   Immunization History  Administered Date(s) Administered   Influenza,inj,Quad PF,6+ Mos 04/23/2019, 05/23/2020, 04/09/2022   Influenza-Unspecified 05/21/2021   Moderna Sars-Covid-2 Vaccination 10/01/2019, 11/02/2019, 05/01/2020   Pfizer Covid-19 Vaccine Bivalent Booster 70yr & up 08/09/2021   Zoster Recombinat (Shingrix) 09/12/2019, 12/13/2019   There are no preventive care reminders to display for this patient.     Past Medical History:  Diagnosis Date   Diverticulosis    Essential hypertension, benign 11/17/2013   Frequent headaches    Gallstones    GERD (gastroesophageal reflux disease)    Hyperlipidemia    diet controlled   Internal hemorrhoids    Obesity    Thyroid disease    hyperactive after pregancy, normal now, no meds   Past Surgical History:  Procedure Laterality Date   CHOLECYSTECTOMY      TONSILLECTOMY      reports that she has never smoked. She has never used smokeless tobacco. She reports that she does not drink alcohol and does not use drugs. family history includes Cancer in her father; Diabetes in an other family member; Heart disease in her mother; Hypertension in her mother; Lung cancer in her father. No Known Allergies Current Outpatient Medications on File Prior to Visit  Medication Sig Dispense Refill   aspirin EC 81 MG tablet Take 1 tablet (81 mg total) by mouth daily. 90 tablet 11   atorvastatin (LIPITOR) 40 MG tablet Take 1 tablet by mouth once daily 30 tablet 0   azithromycin (ZITHROMAX) 250 MG tablet Sig as indicated 6 tablet 0   benzonatate (TESSALON) 200 MG capsule Take 1 capsule (200 mg total) by mouth 2 (two) times daily as needed for cough. 20 capsule 0   Biotin 10000 MCG TABS      diphenhydramine-acetaminophen (TYLENOL PM) 25-500 MG TABS tablet Take 1 tablet by mouth at bedtime as needed (for sleep).     Fluocinolone Acetonide Scalp 0.01 % OIL Apply topically.     fluticasone (CUTIVATE) 0.05 % cream      HYDROcodone bit-homatropine (HYCODAN) 5-1.5 MG/5ML syrup Take 5 mLs by mouth at bedtime as needed for cough. 120 mL 0   hydrocortisone (ANUSOL-HC) 25 MG suppository  Place 1 suppository (25 mg total) rectally at bedtime. 7 suppository 2   irbesartan-hydrochlorothiazide (AVALIDE) 150-12.5 MG tablet Take 1 tablet by mouth once daily 30 tablet 0   latanoprost (XALATAN) 0.005 % ophthalmic solution      levocetirizine (XYZAL) 5 MG tablet      pantoprazole (PROTONIX) 40 MG tablet Take 1 tablet (40 mg total) by mouth daily as needed. 90 tablet 3   phentermine 37.5 MG capsule Take 1 capsule by mouth in the morning 30 capsule 0   trimethoprim-polymyxin b (POLYTRIM) ophthalmic solution 1 drop every 3 (three) hours.     No current facility-administered medications on file prior to visit.        ROS:  All others reviewed and negative.  Objective        PE:   BP 130/78 (BP Location: Left Arm, Patient Position: Sitting, Cuff Size: Large)   Pulse 95   Temp 98.3 F (36.8 C) (Oral)   Ht '5\' 1"'$  (1.549 m)   Wt 166 lb (75.3 kg)   SpO2 96%   BMI 31.37 kg/m                 Constitutional: Pt appears in NAD               HENT: Head: NCAT.                Right Ear: External ear normal.                 Left Ear: External ear normal.                Eyes: . Pupils are equal, round, and reactive to light. Conjunctivae and EOM are normal               Nose: without d/c or deformity               Neck: Neck supple. Gross normal ROM               Cardiovascular: Normal rate and regular rhythm.                 Pulmonary/Chest: Effort normal and breath sounds without rales or wheezing.                Abd:  Soft, NT, ND, + BS, no organomegaly               Neurological: Pt is alert. At baseline orientation, motor grossly intact               Skin: Skin is warm. No rashes, no other new lesions, LE edema - none               Psychiatric: Pt behavior is normal without agitation   Micro: none  Cardiac tracings I have personally interpreted today:  none  Pertinent Radiological findings (summarize): none   Lab Results  Component Value Date   WBC 8.9 04/09/2022   HGB 13.3 04/09/2022   HCT 40.9 04/09/2022   PLT 221.0 04/09/2022   GLUCOSE 87 04/09/2022   CHOL 174 04/09/2022   TRIG 102.0 04/09/2022   HDL 56.70 04/09/2022   LDLDIRECT 158.0 05/03/2015   LDLCALC 97 04/09/2022   ALT 15 04/09/2022   AST 18 04/09/2022   NA 138 04/09/2022   K 4.1 04/09/2022   CL 101 04/09/2022   CREATININE 1.06 04/09/2022   BUN 18 04/09/2022   CO2 27 04/09/2022   TSH 0.86  04/09/2022   HGBA1C 5.8 04/09/2022   MICROALBUR 2.5 (H) 04/09/2022   Assessment/Plan:  Susan Mejia is a 61 y.o. Black or African American [2] female with  has a past medical history of Diverticulosis, Essential hypertension, benign (11/17/2013), Frequent headaches, Gallstones, GERD (gastroesophageal  reflux disease), Hyperlipidemia, Internal hemorrhoids, Obesity, and Thyroid disease.  Vitamin D deficiency Last vitamin D Lab Results  Component Value Date   VD25OH 33.40 01/29/2021   Low, asked to increase oral replacement   Encounter for well adolescent visit without abnormal findings Age and sex appropriate education and counseling updated with regular exercise and diet Referrals for preventative services - none needed Immunizations addressed - for flu shot Smoking counseling  - counseled to quit, pt not redy Evidence for depression or other mood disorder - none significant Most recent labs reviewed. I have personally reviewed and have noted: 1) the patient's medical and social history 2) The patient's current medications and supplements 3) The patient's height, weight, and BMI have been recorded in the chart   Aortic atherosclerosis (HCC) Pt to continue lipitor 40 mg qd, low chol diet, exercise, wt control  Essential hypertension, benign BP Readings from Last 3 Encounters:  04/09/22 130/78  11/18/21 136/84  01/29/21 128/82   Stable, pt to continue medical treatment avalide 150-12.5 mg qd   Hypercholesterolemia Lab Results  Component Value Date   LDLCALC 97 04/09/2022   Uncontrolled,, pt to continue current statin liptior 40 mg as declines change for now, for lower chol diet   Hyperglycemia Lab Results  Component Value Date   HGBA1C 5.8 04/09/2022   Stable, pt to continue current medical treatment  - diet wt control, excercise   Internal hemorrhoids Hx is c/w possible grade 4, consider general surgury referral  Rectal bleeding Small volume mild intermittent, most likely hemorrhoid related but cant r/o other - for GI referral  Bilateral hearing loss due to cerumen impaction Improved, for irrigation   Ceruminosis is noted.  Wax is removed by syringing and manual debridement. Instructions for home care to prevent wax buildup are given.  Followup: No  follow-ups on file.  Cathlean Cower, MD 04/11/2022 8:32 PM [facility]

## 2022-04-09 NOTE — Patient Instructions (Addendum)
Please take OTC Vitamin D3 at 2000 units per day, indefinitely  You had the flu shot today  Your ears were irrigated today  Please continue all other medications as before, and refills have been done if requested.  Please have the pharmacy call with any other refills you may need.  Please continue your efforts at being more active, low cholesterol diet, and weight control.  You are otherwise up to date with prevention measures today.  Please keep your appointments with your specialists as you may have planned  You will be contacted regarding the referral for: General Surgury, and Gastroenterology  Please go to the LAB at the blood drawing area for the tests to be done  You will be contacted by phone if any changes need to be made immediately.  Otherwise, you will receive a letter about your results with an explanation, but please check with MyChart first.  Please remember to sign up for MyChart if you have not done so, as this will be important to you in the future with finding out test results, communicating by private email, and scheduling acute appointments online when needed.  Please make an Appointment to return for your 1 year visit, or sooner if needed

## 2022-04-10 LAB — URINALYSIS, ROUTINE W REFLEX MICROSCOPIC
Bilirubin Urine: NEGATIVE
Hgb urine dipstick: NEGATIVE
Ketones, ur: NEGATIVE
Nitrite: NEGATIVE
RBC / HPF: NONE SEEN (ref 0–?)
Specific Gravity, Urine: 1.02 (ref 1.000–1.030)
Total Protein, Urine: NEGATIVE
Urine Glucose: NEGATIVE
Urobilinogen, UA: 0.2 (ref 0.0–1.0)
pH: 5.5 (ref 5.0–8.0)

## 2022-04-10 LAB — VITAMIN D 25 HYDROXY (VIT D DEFICIENCY, FRACTURES): VITD: 37.99 ng/mL (ref 30.00–100.00)

## 2022-04-10 LAB — HEMOGLOBIN A1C: Hgb A1c MFr Bld: 5.8 % (ref 4.6–6.5)

## 2022-04-11 DIAGNOSIS — K921 Melena: Secondary | ICD-10-CM | POA: Insufficient documentation

## 2022-04-11 NOTE — Assessment & Plan Note (Signed)
Pt to continue lipitor 40 mg qd, low chol diet, exercise, wt control

## 2022-04-11 NOTE — Assessment & Plan Note (Signed)
Improved, for irrigation   Ceruminosis is noted.  Wax is removed by syringing and manual debridement. Instructions for home care to prevent wax buildup are given.

## 2022-04-11 NOTE — Assessment & Plan Note (Signed)
Hx is c/w possible grade 4, consider general surgury referral

## 2022-04-11 NOTE — Assessment & Plan Note (Signed)
Small volume mild intermittent, most likely hemorrhoid related but cant r/o other - for GI referral

## 2022-04-11 NOTE — Assessment & Plan Note (Signed)
Lab Results  Component Value Date   LDLCALC 97 04/09/2022   Uncontrolled,, pt to continue current statin liptior 40 mg as declines change for now, for lower chol diet

## 2022-04-11 NOTE — Assessment & Plan Note (Signed)
Lab Results  Component Value Date   HGBA1C 5.8 04/09/2022   Stable, pt to continue current medical treatment  - diet wt control, excercise

## 2022-04-11 NOTE — Assessment & Plan Note (Signed)
Age and sex appropriate education and counseling updated with regular exercise and diet Referrals for preventative services - none needed Immunizations addressed - for flu shot Smoking counseling  - counseled to quit, pt not redy Evidence for depression or other mood disorder - none significant Most recent labs reviewed. I have personally reviewed and have noted: 1) the patient's medical and social history 2) The patient's current medications and supplements 3) The patient's height, weight, and BMI have been recorded in the chart

## 2022-04-11 NOTE — Assessment & Plan Note (Signed)
BP Readings from Last 3 Encounters:  04/09/22 130/78  11/18/21 136/84  01/29/21 128/82   Stable, pt to continue medical treatment avalide 150-12.5 mg qd

## 2022-05-08 NOTE — Progress Notes (Signed)
05/09/2022 Susan Mejia [id number] [birth year]  Referring provider: Biagio Borg, MD Primary GI doctor: Dr. Hilarie Fredrickson  ASSESSMENT AND PLAN:   Rectal bleeding with history of hemorrhoids, some urgency and loose stools Last colon 2014, diverticulosis, recall 10 years Repeated episodes of rectal bleeding, known hemorrhoids, has suppositories to use at home, with urgency and more loose stools will schedule for colonoscopy.  We have discussed the risks of bleeding, infection, perforation, medication reactions, and remote risk of death associated with colonoscopy. All questions were answered and the patient acknowledges these risk and wishes to proceed.  Irritable bowel syndrome with both constipation and diarrhea Possible IBS-D if colonoscopy negative, can do trial of IBGARD daily. Add on citracel/benefiber FODMAP, trial off lactulose and lifestyle changes discussed Consider xifaxin trial pending results   History of Present Illness:  61 y.o. female  with a past medical history of hypertension, hyperlipidemia, GERD, gallstones, diverticulosis and others listed below, returns to clinic today for evaluation of hematochezia.  06/2013 colonoscopy Dr. Hilarie Fredrickson for screening purposes diverticulosis, unremarkable 11/28/2020 office visit [facility] for rectal bleeding, referred by Dr. Zenia Resides from [facility] due to anal discomfort during examination with rectal bleeding.   Found internal hemorrhoids, given hydrocortisone suppositories, offered colonoscopy however this was not pursued.  Presents today with complaints of hematochezia. She states about 1-2 weeks ago finally stopped but she had 2 week of rectal bleeding prior. Every morning with BM she had large volume in the toliet and a lot on the tissue, BRB that was thick with clots. She has rectal pain with BM and separate every other day, no burning or itching.  She has very bad hemorrhoids external and internal per patient, suppose to follow up with [facility],  she is not using any medications at this time, no fiber.  She has BM every day, can alternate between formed, diarrhea and hard but states the majority are bristol scale 6. Has urgency with the BM's.  Occ LLQ AB pain, better with BM.  No fever, no chills.  No weight loss.  She denies GERD, dysphagia, melena, AB pain.  She denies nausea, vomiting.    She  reports that she has never smoked. She has never used smokeless tobacco. She reports that she does not drink alcohol and does not use drugs. Her family history includes Cancer in her father; Diabetes in an other family member; Heart disease in her mother; Hypertension in her mother; Lung cancer in her father.   Current Medications:    Current Outpatient Medications (Cardiovascular):    atorvastatin (LIPITOR) 40 MG tablet, Take 1 tablet by mouth once daily   irbesartan-hydrochlorothiazide (AVALIDE) 150-12.5 MG tablet, Take 1 tablet by mouth once daily (Patient not taking: Reported on 05/09/2022)  Current Outpatient Medications (Respiratory):    benzonatate (TESSALON) 200 MG capsule, Take 1 capsule (200 mg total) by mouth 2 (two) times daily as needed for cough. (Patient not taking: Reported on 05/09/2022)   HYDROcodone bit-homatropine (HYCODAN) 5-1.5 MG/5ML syrup, Take 5 mLs by mouth at bedtime as needed for cough. (Patient not taking: Reported on 05/09/2022)   levocetirizine (XYZAL) 5 MG tablet,   Current Outpatient Medications (Analgesics):    aspirin EC 81 MG tablet, Take 1 tablet (81 mg total) by mouth daily.   Current Outpatient Medications (Other):    diphenhydramine-acetaminophen (TYLENOL PM) 25-500 MG TABS tablet, Take 1 tablet by mouth at bedtime as needed (for sleep).   Fluocinolone Acetonide Scalp 0.01 % OIL, Apply topically.   azithromycin (  ZITHROMAX) 250 MG tablet, Sig as indicated (Patient not taking: Reported on 05/09/2022)   Biotin 10000 MCG TABS,    fluticasone (CUTIVATE) 0.05 % cream,    hydrocortisone (ANUSOL-HC)  25 MG suppository, Place 1 suppository (25 mg total) rectally at bedtime. (Patient not taking: Reported on 05/09/2022)   latanoprost (XALATAN) 0.005 % ophthalmic solution,    pantoprazole (PROTONIX) 40 MG tablet, Take 1 tablet (40 mg total) by mouth daily as needed. (Patient not taking: Reported on 05/09/2022)   phentermine 37.5 MG capsule, Take 1 capsule by mouth in the morning (Patient not taking: Reported on 05/09/2022)   trimethoprim-polymyxin b (POLYTRIM) ophthalmic solution, 1 drop every 3 (three) hours. (Patient not taking: Reported on 05/09/2022)  Surgical History:  She  has a past surgical history that includes Cholecystectomy and Tonsillectomy.  Current Medications, Allergies, Past Medical History, Past Surgical History, Family History and Social History were reviewed in [employer] record.  Physical Exam: BP 118/78   Pulse 94   Ht '5\' 3"'$  (1.6 m)   Wt 168 lb 12.8 oz (76.6 kg)   SpO2 98%   BMI 29.90 kg/m  General:   Pleasant, well developed female in no acute distress Heart : Regular rate and rhythm; no murmurs Pulm: Clear anteriorly; no wheezing Abdomen:  Soft, Non-distended AB, Active bowel sounds. mild tenderness in the LLQ. Without guarding and Without rebound, No organomegaly appreciated. Rectal: Not evaluated Extremities:  without  edema. Neurologic:  Alert and  oriented x4;  No focal deficits.  Psych:  Cooperative. Normal mood and affect.   Vladimir Crofts, PA-C 05/09/22

## 2022-05-09 ENCOUNTER — Ambulatory Visit: Payer: BC Managed Care – PPO | Admitting: Physician Assistant

## 2022-05-09 ENCOUNTER — Encounter: Payer: Self-pay | Admitting: Physician Assistant

## 2022-05-09 DIAGNOSIS — K582 Mixed irritable bowel syndrome: Secondary | ICD-10-CM | POA: Diagnosis not present

## 2022-05-09 DIAGNOSIS — K625 Hemorrhage of anus and rectum: Secondary | ICD-10-CM

## 2022-05-09 DIAGNOSIS — K648 Other hemorrhoids: Secondary | ICD-10-CM | POA: Diagnosis not present

## 2022-05-09 MED ORDER — NA SULFATE-K SULFATE-MG SULF 17.5-3.13-1.6 GM/177ML PO SOLN: 1.0000 | ORAL | 0 refills | Status: DC

## 2022-05-09 NOTE — Patient Instructions (Addendum)
If you are age 60 or younger, your body mass index should be between 19-25. Your Body mass index is 29.9 kg/m. If this is out of the aformentioned range listed, please consider follow up with your Primary Care Provider.  ________________________________________________________  The [facility] providers would like to encourage you to use [facility] to communicate with providers for non-urgent requests or questions.  Due to long hold times on the telephone, sending your provider a message by [facility] may be a faster and more efficient way to get a response.  Please allow 48 business hours for a response.  Please remember that this is for non-urgent requests.  _______________________________________________________  Susan Mejia have been scheduled for a colonoscopy. Please follow written instructions given to you at your visit today.  Please pick up your prep supplies at the pharmacy within the next 1-3 days. If you use inhalers (even only as needed), please bring them with you on the day of your procedure.  Due to recent changes in healthcare laws, you may see the results of your imaging and laboratory studies on MyChart before your provider has had a chance to review them.  We understand that in some cases there may be results that are confusing or concerning to you. Not all laboratory results come back in the same time frame and the provider may be waiting for multiple results in order to interpret others.  Please give [place] 48 hours in order for your provider to thoroughly review all the results before contacting the office for clarification of your results.   First do a trial off milk/lactose products if you use them.  Add fiber like benefiber or citracel once a day Increase activity Can do trial of IBGard which is over the counter for AB pain- Take 1-2 capsules once a day for maintence or twice a day during a flare  Please try low FODMAP diet- see below- start with eliminating just one column at a time, the  table at the very bottom contains foods that are safe to take   FODMAP stands for fermentable oligo-, di-, mono-saccharides and polyols (1). These are the scientific terms used to classify groups of carbs that are notorious for triggering digestive symptoms like bloating, gas and stomach pain.     Please do sitz baths- these can be found at the pharmacy. It is a English as a second language teacher that is put in your toliet.  Please increase fiber or add benefiber, increase water and increase acitivity.  Will send in hydrocoritsone suppository, cheapest with GOODRX from [employer], [employer], [employer] or [employer] if your insurance does not pay for it. If the hemorrhoid suppository sent in is too expensive you can do this over the counter trick.  Apply a pea size amount of over the counter Anusol HC cream to the tip of an over the counter PrepH suppository and insert rectally once every night for at least 7 nights.  If this does not improve there are procedures that can be done.   About Hemorrhoids  Hemorrhoids are swollen veins in the lower rectum and anus.  Also called piles, hemorrhoids are a common problem.  Hemorrhoids may be internal (inside the rectum) or external (around the anus).  Internal Hemorrhoids  Internal hemorrhoids are often painless, but they rarely cause bleeding.  The internal veins may stretch and fall down (prolapse) through the anus to the outside of the body.  The veins may then become irritated and painful.  External Hemorrhoids  External hemorrhoids can be easily seen  or felt around the anal opening.  They are under the skin around the anus.  When the swollen veins are scratched or broken by straining, rubbing or wiping they sometimes bleed.  How Hemorrhoids Occur  Veins in the rectum and around the anus tend to swell under pressure.  Hemorrhoids can result from increased pressure in the veins of your anus or rectum.  Some sources of pressure are:  Straining to have a bowel movement  because of constipation Waiting too long to have a bowel movement Coughing and sneezing often Sitting for extended periods of time, including on the toilet Diarrhea Obesity Trauma or injury to the anus Some liver diseases Stress Family history of hemorrhoids Pregnancy  Pregnant women should try to avoid becoming constipated, because they are more likely to have hemorrhoids during pregnancy.  In the last trimester of pregnancy, the enlarged uterus may press on blood vessels and causes hemorrhoids.  In addition, the strain of childbirth sometimes causes hemorrhoids after the birth.  Symptoms of Hemorrhoids  Some symptoms of hemorrhoids include: Swelling and/or a tender lump around the anus Itching, mild burning and bleeding around the anus Painful bowel movements with or without constipation Bright red blood covering the stool, on toilet paper or in the toilet bowel.   Symptoms usually go away within a few days.  Always talk to your doctor about any bleeding to make sure it is not from some other causes.  Diagnosing and Treating Hemorrhoids  Diagnosis is made by an examination by your healthcare provider.  Special test can be performed by your doctor.    Most cases of hemorrhoids can be treated with: High-fiber diet: Eat more high-fiber foods, which help prevent constipation.  Ask for more detailed fiber information on types and sources of fiber from your healthcare provider. Fluids: Drink plenty of water.  This helps soften bowel movements so they are easier to pass. Sitz baths and cold packs: Sitting in lukewarm water two or three times a day for 15 minutes cleases the anal area and may relieve discomfort.  If the water is too hot, swelling around the anus will get worse.  Placing a cloth-covered ice pack on the anus for ten minutes four times a day can also help reduce selling.  Gently pushing a prolapsed hemorrhoid back inside after the bath or ice pack can be helpful. Medications:  For mild discomfort, your healthcare provider may suggest over-the-counter pain medication or prescribe a cream or ointment for topical use.  The cream may contain witch hazel, zinc oxide or petroleum jelly.  Medicated suppositories are also a treatment option.  Always consult your doctor before applying medications or creams. Procedures and surgeries: There are also a number of procedures and surgeries to shrink or remove hemorrhoids in more serious cases.  Talk to your physician about these options.  You can often prevent hemorrhoids or keep them from becoming worse by maintaining a healthy lifestyle.  Eat a fiber-rich diet of fruits, vegetables and whole grains.  Also, drink plenty of water and exercise regularly.   2007, [employer]4  Thank you for entrusting me with your care and choosing [facility].  Vicie Mutters, PA-C

## 2022-05-15 ENCOUNTER — Other Ambulatory Visit: Payer: Self-pay | Admitting: Internal Medicine

## 2022-05-15 NOTE — Telephone Encounter (Signed)
Please refill as per office routine med refill policy (all routine meds to be refilled for 3 mo or monthly (per pt preference) up to one year from last visit, then month to month grace period for 3 mo, then further med refills will have to be denied) ? ?

## 2022-05-17 NOTE — Progress Notes (Signed)
Addendum: Reviewed and agree with assessment and management plan. Stephane Junkins M, MD  

## 2022-06-08 ENCOUNTER — Other Ambulatory Visit: Payer: Self-pay | Admitting: Internal Medicine

## 2022-06-08 NOTE — Telephone Encounter (Signed)
Please refill as per office routine med refill policy (all routine meds to be refilled for 3 mo or monthly (per pt preference) up to one year from last visit, then month to month grace period for 3 mo, then further med refills will have to be denied) ? ?

## 2022-06-16 ENCOUNTER — Encounter: Payer: Self-pay | Admitting: Internal Medicine

## 2022-06-22 ENCOUNTER — Encounter: Payer: Self-pay | Admitting: Certified Registered Nurse Anesthetist

## 2022-06-26 ENCOUNTER — Ambulatory Visit (AMBULATORY_SURGERY_CENTER): Payer: BC Managed Care – PPO | Admitting: Internal Medicine

## 2022-06-26 ENCOUNTER — Encounter: Payer: Self-pay | Admitting: Internal Medicine

## 2022-06-26 VITALS — BP 130/90 | HR 69 | Temp 97.8°F | Resp 14 | Ht 63.0 in | Wt 168.0 lb

## 2022-06-26 DIAGNOSIS — K635 Polyp of colon: Secondary | ICD-10-CM | POA: Diagnosis present

## 2022-06-26 DIAGNOSIS — K5730 Diverticulosis of large intestine without perforation or abscess without bleeding: Secondary | ICD-10-CM | POA: Diagnosis not present

## 2022-06-26 DIAGNOSIS — R197 Diarrhea, unspecified: Secondary | ICD-10-CM

## 2022-06-26 DIAGNOSIS — K625 Hemorrhage of anus and rectum: Secondary | ICD-10-CM

## 2022-06-26 DIAGNOSIS — K649 Unspecified hemorrhoids: Secondary | ICD-10-CM | POA: Diagnosis not present

## 2022-06-26 DIAGNOSIS — D120 Benign neoplasm of cecum: Secondary | ICD-10-CM

## 2022-06-26 MED ORDER — SODIUM CHLORIDE 0.9 % IV SOLN: 500.0000 mL | Freq: Once | INTRAVENOUS | Status: DC

## 2022-06-26 NOTE — Progress Notes (Signed)
VS completed by CW.   I have reviewed the patient's medical history in detail and updated the computerized patient record.  

## 2022-06-26 NOTE — Progress Notes (Signed)
Report given to PACU, vss 

## 2022-06-26 NOTE — Progress Notes (Signed)
Called to room to assist during endoscopic procedure.  Patient ID and intended procedure confirmed with present staff. Received instructions for my participation in the procedure from the performing physician.  

## 2022-06-26 NOTE — Patient Instructions (Signed)
Handouts provided on polyps, diverticulosis and hemorrhoids.   Resume previous diet. Continue present medications.  Await pathology results.  Would recommend 2 tablespoons of Benefiber or Metamucil daily.  Hemorrhoidal banding is an option if interested for treatment of internal hemorrhoidal bleeding.  Repeat colonoscopy is recommended. The colonoscopy date will be determined after pathology results from today's exam become available for review.   YOU HAD AN ENDOSCOPIC PROCEDURE TODAY AT [facility]:   Refer to the procedure report that was given to you for any specific questions about what was found during the examination.  If the procedure report does not answer your questions, please call your gastroenterologist to clarify.  If you requested that your care partner not be given the details of your procedure findings, then the procedure report has been included in a sealed envelope for you to review at your convenience later.  YOU SHOULD EXPECT: Some feelings of bloating in the abdomen. Passage of more gas than usual.  Walking can help get rid of the air that was put into your GI tract during the procedure and reduce the bloating. If you had a lower endoscopy (such as a colonoscopy or flexible sigmoidoscopy) you may notice spotting of blood in your stool or on the toilet paper. If you underwent a bowel prep for your procedure, you may not have a normal bowel movement for a few days.  Please Note:  You might notice some irritation and congestion in your nose or some drainage.  This is from the oxygen used during your procedure.  There is no need for concern and it should clear up in a day or so.  SYMPTOMS TO REPORT IMMEDIATELY:  Following lower endoscopy (colonoscopy or flexible sigmoidoscopy):  Excessive amounts of blood in the stool  Significant tenderness or worsening of abdominal pains  Swelling of the abdomen that is new, acute  Fever of 100F or higher  For urgent or  emergent issues, a gastroenterologist can be reached at any hour by calling [phone]. Do not use MyChart messaging for urgent concerns.    DIET:  We do recommend a small meal at first, but then you may proceed to your regular diet.  Drink plenty of fluids but you should avoid alcoholic beverages for 24 hours.  ACTIVITY:  You should plan to take it easy for the rest of today and you should NOT DRIVE or use heavy machinery until tomorrow (because of the sedation medicines used during the test).    FOLLOW UP: Our staff will call the number listed on your records the next business day following your procedure.  We will call around 7:15- 8:00 am to check on you and address any questions or concerns that you may have regarding the information given to you following your procedure. If we do not reach you, we will leave a message.     If any biopsies were taken you will be contacted by phone or by letter within the next 1-3 weeks.  Please call us at [phone] if you have not heard about the biopsies in 3 weeks.    SIGNATURES/CONFIDENTIALITY: You and/or your care partner have signed paperwork which will be entered into your electronic medical record.  These signatures attest to the fact that that the information above on your After Visit Summary has been reviewed and is understood.  Full responsibility of the confidentiality of this discharge information lies with you and/or your care-partner.

## 2022-06-26 NOTE — Progress Notes (Signed)
GASTROENTEROLOGY PROCEDURE H&P NOTE   Primary Care Physician: Biagio Borg, MD    Reason for Procedure:  Intermittent rectal bleeding, fecal urgency  Plan:    Colonoscopy  Patient is appropriate for endoscopic procedure(s) in the ambulatory ([place]) setting.  The nature of the procedure, as well as the risks, benefits, and alternatives were carefully and thoroughly reviewed with the patient. Ample time for discussion and questions allowed. The patient understood, was satisfied, and agreed to proceed.     HPI: Susan Mejia is a 61 y.o. female who presents for colonoscopy.  Medical history as below.  Tolerated the prep.  No recent chest pain or shortness of breath.  No abdominal pain today.  Past Medical History:  Diagnosis Date   Cataract    Diverticulosis    Essential hypertension, benign 11/17/2013   Frequent headaches    Gallstones    GERD (gastroesophageal reflux disease)    Hyperlipidemia    diet controlled   Internal hemorrhoids    Obesity    Thyroid disease    hyperactive after pregancy, normal now, no meds    Past Surgical History:  Procedure Laterality Date   CHOLECYSTECTOMY     COLONOSCOPY     TONSILLECTOMY      Prior to Admission medications   Medication Sig Start Date End Date Taking? Authorizing Provider  atorvastatin (LIPITOR) 40 MG tablet Take 1 tablet by mouth once daily 05/15/22  Yes Biagio Borg, MD  Biotin 10000 MCG TABS    Yes [provider]  diphenhydramine-acetaminophen (TYLENOL PM) 25-500 MG TABS tablet Take 1 tablet by mouth at bedtime as needed (for sleep).   Yes [provider]  irbesartan-hydrochlorothiazide (AVALIDE) 150-12.5 MG tablet Take 1 tablet by mouth once daily 06/09/22  Yes Biagio Borg, MD  aspirin EC 81 MG tablet Take 1 tablet (81 mg total) by mouth daily. 05/04/15   Biagio Borg, MD  azithromycin [facility]) 250 MG tablet Sig as indicated Patient not taking: Reported on 05/09/2022 11/18/21   Horald Pollen, MD  benzonatate (TESSALON) 200 MG capsule Take 1 capsule (200 mg total) by mouth 2 (two) times daily as needed for cough. Patient not taking: Reported on 05/09/2022 11/18/21   Horald Pollen, MD  Fluocinolone Acetonide Scalp 0.01 % OIL Apply topically. 11/30/20   [provider]  fluticasone (CUTIVATE) 0.05 % cream     [provider]  HYDROcodone bit-homatropine (HYCODAN) 5-1.5 MG/5ML syrup Take 5 mLs by mouth at bedtime as needed for cough. Patient not taking: Reported on 05/09/2022 11/18/21   Horald Pollen, MD  hydrocortisone (ANUSOL-HC) 25 MG suppository Place 1 suppository (25 mg total) rectally at bedtime. Patient not taking: Reported on 05/09/2022 11/28/20   Zehr, Laban Emperor, PA-C  latanoprost (XALATAN) 0.005 % ophthalmic solution  01/25/20   [provider]  levocetirizine (XYZAL) 5 MG tablet  11/30/20   [provider]  pantoprazole (PROTONIX) 40 MG tablet Take 1 tablet (40 mg total) by mouth daily as needed. Patient not taking: Reported on 05/09/2022 01/27/20   Biagio Borg, MD  phentermine 37.5 MG capsule Take 1 capsule by mouth in the morning Patient not taking: Reported on 05/09/2022 09/11/21   Biagio Borg, MD  trimethoprim-polymyxin b [facility]) ophthalmic solution 1 drop every 3 (three) hours. Patient not taking: Reported on 05/09/2022 10/24/19   [provider]    Current Outpatient Medications  Medication Sig Dispense Refill   atorvastatin (LIPITOR) 40  MG tablet Take 1 tablet by mouth once daily 90 tablet 3   Biotin 10000 MCG TABS      diphenhydramine-acetaminophen (TYLENOL PM) 25-500 MG TABS tablet Take 1 tablet by mouth at bedtime as needed (for sleep).     irbesartan-hydrochlorothiazide (AVALIDE) 150-12.5 MG tablet Take 1 tablet by mouth once daily 90 tablet 0   aspirin EC 81 MG tablet Take 1 tablet (81 mg total) by mouth daily. 90 tablet 11   azithromycin (ZITHROMAX) 250 MG tablet Sig as indicated (Patient not  taking: Reported on 05/09/2022) 6 tablet 0   benzonatate (TESSALON) 200 MG capsule Take 1 capsule (200 mg total) by mouth 2 (two) times daily as needed for cough. (Patient not taking: Reported on 05/09/2022) 20 capsule 0   Fluocinolone Acetonide Scalp 0.01 % OIL Apply topically.     fluticasone (CUTIVATE) 0.05 % cream  (Patient not taking: Reported on 05/09/2022)     HYDROcodone bit-homatropine (HYCODAN) 5-1.5 MG/5ML syrup Take 5 mLs by mouth at bedtime as needed for cough. (Patient not taking: Reported on 05/09/2022) 120 mL 0   hydrocortisone (ANUSOL-HC) 25 MG suppository Place 1 suppository (25 mg total) rectally at bedtime. (Patient not taking: Reported on 05/09/2022) 7 suppository 2   latanoprost (XALATAN) 0.005 % ophthalmic solution  (Patient not taking: Reported on 05/09/2022)     levocetirizine (XYZAL) 5 MG tablet  (Patient not taking: Reported on 05/09/2022)     pantoprazole (PROTONIX) 40 MG tablet Take 1 tablet (40 mg total) by mouth daily as needed. (Patient not taking: Reported on 05/09/2022) 90 tablet 3   phentermine 37.5 MG capsule Take 1 capsule by mouth in the morning (Patient not taking: Reported on 05/09/2022) 30 capsule 0   trimethoprim-polymyxin b (POLYTRIM) ophthalmic solution 1 drop every 3 (three) hours. (Patient not taking: Reported on 05/09/2022)     Current Facility-Administered Medications  Medication Dose Route Frequency Provider Last Rate Last Admin   0.9 %  sodium chloride infusion  500 mL Intravenous Once Andra Heslin, Lajuan Lines, MD        Allergies as of 06/26/2022   (No Known Allergies)    Family History  Problem Relation Age of Onset   Lung cancer Father    Cancer Father        lung cancer   Heart disease Mother    Hypertension Mother    Diabetes Other    Colon cancer Neg Hx    Esophageal cancer Neg Hx    Rectal cancer Neg Hx    Stomach cancer Neg Hx     Social History   Socioeconomic History   Marital status: Single    Spouse name: Not on file   Number  of children: 1   Years of education: 16   Highest education level: Not on file  Occupational History   Occupation: Pharmacist, community: OTHER  Tobacco Use   Smoking status: Never   Smokeless tobacco: Never  Vaping Use   Vaping Use: Never used  Substance and Sexual Activity   Alcohol use: No   Drug use: No   Sexual activity: Not on file  Other Topics Concern   Not on file  Social History Narrative   single   Social Determinants of Health   Financial Resource Strain: Not on file  Food Insecurity: Not on file  Transportation Needs: Not on file  Physical Activity: Not on file  Stress: Not on file  Social Connections: Not on file  Intimate Partner Violence: Not on file    Physical Exam: Vital signs in last 24 hours: '@BP'$  (!) 143/98   Pulse 66   Temp 97.8 F (36.6 C) (Temporal)   Resp 11   Ht '5\' 3"'$  (1.6 m)   Wt 168 lb (76.2 kg)   SpO2 100%   BMI 29.76 kg/m  GEN: NAD EYE: Sclerae anicteric ENT: MMM CV: Non-tachycardic Pulm: CTA b/l GI: Soft, NT/ND NEURO:  Alert & Oriented x 3   Zenovia Jarred, MD [facility]  06/26/2022 1:41 PM

## 2022-06-26 NOTE — Op Note (Signed)
[place] Patient Name: Susan Mejia Procedure Date: 06/26/2022 1:04 PM MRN: [id number] Endoscopist: Jerene Bears , MD, [id number] Age: 61 Referring MD:  Date of Birth: [birth year] Gender: Female Account #: [id number] Procedure:                Colonoscopy Indications:              Rectal bleeding, fecal urgency, intermittent loose                            stools Medicines:                Monitored Anesthesia Care Procedure:                Pre-Anesthesia Assessment:                           - Prior to the procedure, a History and Physical                            was performed, and patient medications and                            allergies were reviewed. The patient's tolerance of                            previous anesthesia was also reviewed. The risks                            and benefits of the procedure and the sedation                            options and risks were discussed with the patient.                            All questions were answered, and informed consent                            was obtained. Prior Anticoagulants: The patient has                            taken no anticoagulant or antiplatelet agents. ASA                            Grade Assessment: II - A patient with mild systemic                            disease. After reviewing the risks and benefits,                            the patient was deemed in satisfactory condition to                            undergo the procedure.  After obtaining informed consent, the colonoscope                            was passed under direct vision. Throughout the                            procedure, the patient's blood pressure, pulse, and                            oxygen saturations were monitored continuously. The                            Olympus PCF-H190DL [id number]) Colonoscope was                            introduced through the anus and advanced to the                             cecum, identified by appendiceal orifice and                            ileocecal valve. The colonoscopy was performed                            without difficulty. The patient tolerated the                            procedure well. The quality of the bowel                            preparation was good. Scope In: 1:44:47 PM Scope Out: 1:57:36 PM Scope Withdrawal Time: 0 hours 9 minutes 41 seconds  Total Procedure Duration: 0 hours 12 minutes 49 seconds  Findings:                 The digital rectal exam was normal.                           A 2 mm polyp was found in the cecum. The polyp was                            sessile. The polyp was removed with a cold biopsy                            forceps. Resection and retrieval were complete.                           Multiple large-mouthed, medium-mouthed and                            small-mouthed diverticula were found in the sigmoid                            colon, descending colon, transverse colon and  ascending colon.                           Internal hemorrhoids were found during                            retroflexion. The hemorrhoids were small. Complications:            No immediate complications. Estimated Blood Loss:     Estimated blood loss: none. Impression:               - One 2 mm polyp in the cecum, removed with a cold                            biopsy forceps. Resected and retrieved.                           - Moderate diverticulosis in the sigmoid colon, in                            the descending colon, in the transverse colon and                            in the ascending colon.                           - Internal hemorrhoids. Most likely source of                            recent rectal bleeding. Recommendation:           - Patient has a contact number available for                            emergencies. The signs and symptoms of potential                             delayed complications were discussed with the                            patient. Return to normal activities tomorrow.                            Written discharge instructions were provided to the                            patient.                           - Resume previous diet.                           - Continue present medications.                           - Await pathology results.                           -  Would recommend 2 tablespoons of Benefiber or                            Metamucil daily.                           - Hemorrhoidal banding is an option if interested                            for treatment of internal hemorrhoidal bleeding.                           - Repeat colonoscopy is recommended. The                            colonoscopy date will be determined after pathology                            results from today's exam become available for                            review. Jerene Bears, MD 06/26/2022 2:01:55 PM This report has been signed electronically.

## 2022-06-27 ENCOUNTER — Telehealth: Payer: Self-pay

## 2022-06-27 NOTE — Telephone Encounter (Signed)
Left message on follow up call. 

## 2022-07-02 ENCOUNTER — Encounter: Payer: Self-pay | Admitting: Internal Medicine

## 2022-08-01 ENCOUNTER — Encounter: Payer: Self-pay | Admitting: Internal Medicine

## 2022-08-01 ENCOUNTER — Ambulatory Visit: Payer: BC Managed Care – PPO | Admitting: Internal Medicine

## 2022-08-01 VITALS — BP 104/86 | HR 72 | Ht 63.0 in | Wt 168.0 lb

## 2022-08-01 DIAGNOSIS — K648 Other hemorrhoids: Secondary | ICD-10-CM | POA: Diagnosis not present

## 2022-08-01 DIAGNOSIS — K582 Mixed irritable bowel syndrome: Secondary | ICD-10-CM

## 2022-08-01 NOTE — Progress Notes (Signed)
Susan Mejia is a 62 year old female with a history of symptomatic hemorrhoids, intermittent constipation with irritable bowel who is seen to consider hemorrhoidal banding.  Send the office on 05/09/2022 by Vicie Mutters, PA-C  Came for colonoscopy which I performed on 06/26/2022.  Colonoscopy revealed diverticulosis in the ascending to sigmoid colon.  Internal hemorrhoids were seen.  A 2 mm polyp was removed from the cecum but not adenomatous.  This was a benign lymphoid aggregate.  She has had symptomatic hemorrhoids for years.  Her symptoms include: Rectal bleeding with bowel movement, prolapse, discomfort and difficulty with cleaning after bowel movement.  Also ongoing issue with infrequent and hard stool.  She is using Metamucil which has helped but not completely.  Symptomatic internal hemorrhoids with prolapse and bleeding --rubber band therapy today.  PROCEDURE NOTE:  The patient presents with symptomatic grade 2 internal hemorrhoids, requesting rubber band ligation of her hemorrhoidal disease.  All risks, benefits and alternative forms of therapy were described and informed consent was obtained.   The anorectum was pre-medicated with 0.125% nitroglycerin ointment The decision was made to band the RP internal hemorrhoid, and the [place] was used to perform band ligation without complication.   Digital anorectal examination was then performed to assure proper positioning of the band, and to adjust the banded tissue as required.  The patient was discharged home without pain or other issues.  Dietary and behavioral recommendations were given and along with follow-up instructions.     The following adjunctive treatments were recommended: Continue Metamucil  2.  IBS with hard stool -we discussed how normalization of bowel habit will help guard against exacerbation of hemorrhoids. -- Continue Metamucil 2 heaping tablespoons daily -- Add Colace 100 to 200 mg nightly  The  patient will return as scheduled for follow-up and possible additional banding as required. No complications were encountered and the patient tolerated the procedure well.

## 2022-08-01 NOTE — Patient Instructions (Addendum)
Please purchase the following medications over the counter and take as directed: Metamucil  Colace 100 mg daily. _______________________________________________________________________   Susan Mejia have been scheduled for your 2nd hemorrhoidal banding on Wednesday 09/17/22 at 3:40 pm.  HEMORRHOID BANDING PROCEDURE    FOLLOW-UP CARE   The procedure you have had should have been relatively painless since the banding of the area involved does not have nerve endings and there is no pain sensation.  The rubber band cuts off the blood supply to the hemorrhoid and the band may fall off as soon as 48 hours after the banding (the band may occasionally be seen in the toilet bowl following a bowel movement). You may notice a temporary feeling of fullness in the rectum which should respond adequately to plain Tylenol or Motrin.  Following the banding, avoid strenuous exercise that evening and resume full activity the next day.  A sitz bath (soaking in a warm tub) or bidet is soothing, and can be useful for cleansing the area after bowel movements.     To avoid constipation, take two tablespoons of natural wheat bran, natural oat bran, flax, Benefiber or any over the counter fiber supplement and increase your water intake to 7-8 glasses daily.    Unless you have been prescribed anorectal medication, do not put anything inside your rectum for two weeks: No suppositories, enemas, fingers, etc.  Occasionally, you may have more bleeding than usual after the banding procedure.  This is often from the untreated hemorrhoids rather than the treated one.  Don't be concerned if there is a tablespoon or so of blood.  If there is more blood than this, lie flat with your bottom higher than your head and apply an ice pack to the area. If the bleeding does not stop within a half an hour or if you feel faint, call our office at [phone] or go to the emergency room.  Problems are not common; however, if there is a  substantial amount of bleeding, severe pain, chills, fever or difficulty passing urine (very rare) or other problems, you should call us at [phone] or report to the nearest emergency room.  Do not stay seated continuously for more than 2-3 hours for a day or two after the procedure.  Tighten your buttock muscles 10-15 times every two hours and take 10-15 deep breaths every 1-2 hours.  Do not spend more than a few minutes on the toilet if you cannot empty your bowel; instead re-visit the toilet at a later time.   _______________________________________________________  If you are age 61 or older, your body mass index should be between 23-30. Your Body mass index is 29.76 kg/m. If this is out of the aforementioned range listed, please consider follow up with your Primary Care Provider.  If you are age 8 or younger, your body mass index should be between 19-25. Your Body mass index is 29.76 kg/m. If this is out of the aformentioned range listed, please consider follow up with your Primary Care Provider.   ________________________________________________________  The [facility] providers would like to encourage you to use [facility] to communicate with providers for non-urgent requests or questions.  Due to long hold times on the telephone, sending your provider a message by [facility] may be a faster and more efficient way to get a response.  Please allow 48 business hours for a response.  Please remember that this is for non-urgent requests.  _______________________________________________________  Due to recent changes in healthcare laws, you  may see the results of your imaging and laboratory studies on MyChart before your provider has had a chance to review them.  We understand that in some cases there may be results that are confusing or concerning to you. Not all laboratory results come back in the same time frame and the provider may be waiting for multiple results in order to interpret  others.  Please give [place] 48 hours in order for your provider to thoroughly review all the results before contacting the office for clarification of your results.

## 2022-09-17 ENCOUNTER — Ambulatory Visit: Payer: BC Managed Care – PPO | Admitting: Internal Medicine

## 2022-09-17 ENCOUNTER — Encounter: Payer: Self-pay | Admitting: Internal Medicine

## 2022-09-17 VITALS — BP 96/64 | HR 72 | Ht 61.0 in | Wt 169.5 lb

## 2022-09-17 DIAGNOSIS — K648 Other hemorrhoids: Secondary | ICD-10-CM

## 2022-09-17 NOTE — Patient Instructions (Addendum)
_______________________________________________________  If your blood pressure at your visit was 140/90 or greater, please contact your primary care physician to follow up on this.  If you are age 61 or younger, your body mass index should be between 19-25. Your Body mass index is 32.03 kg/m. If this is out of the aformentioned range listed, please consider follow up with your Primary Care Provider.  ________________________________________________________  The [facility] providers would like to encourage you to use [facility] to communicate with providers for non-urgent requests or questions.  Due to long hold times on the telephone, sending your provider a message by [facility] may be a faster and more efficient way to get a response.  Please allow 48 business hours for a response.  Please remember that this is for non-urgent requests.  _______________________________________________________  Dennis Bast should contact our office if you decide to have 3rd banding.  If your problems seemed to be resolved after today, you should not need to have 3rd banding.  HEMORRHOID BANDING PROCEDURE    FOLLOW-UP CARE   The procedure you have had should have been relatively painless since the banding of the area involved does not have nerve endings and there is no pain sensation.  The rubber band cuts off the blood supply to the hemorrhoid and the band may fall off as soon as 48 hours after the banding (the band may occasionally be seen in the toilet bowl following a bowel movement). You may notice a temporary feeling of fullness in the rectum which should respond adequately to plain Tylenol or Motrin.  Following the banding, avoid strenuous exercise that evening and resume full activity the next day.  A sitz bath (soaking in a warm tub) or bidet is soothing, and can be useful for cleansing the area after bowel movements.     To avoid constipation, take two tablespoons of natural wheat bran, natural oat bran, flax,  Benefiber or any over the counter fiber supplement and increase your water intake to 7-8 glasses daily.    Unless you have been prescribed anorectal medication, do not put anything inside your rectum for two weeks: No suppositories, enemas, fingers, etc.  Occasionally, you may have more bleeding than usual after the banding procedure.  This is often from the untreated hemorrhoids rather than the treated one.  Don't be concerned if there is a tablespoon or so of blood.  If there is more blood than this, lie flat with your bottom higher than your head and apply an ice pack to the area. If the bleeding does not stop within a half an hour or if you feel faint, call our office at [phone] or go to the emergency room.  Problems are not common; however, if there is a substantial amount of bleeding, severe pain, chills, fever or difficulty passing urine (very rare) or other problems, you should call us at [phone] or report to the nearest emergency room.  Do not stay seated continuously for more than 2-3 hours for a day or two after the procedure.  Tighten your buttock muscles 10-15 times every two hours and take 10-15 deep breaths every 1-2 hours.  Do not spend more than a few minutes on the toilet if you cannot empty your bowel; instead re-visit the toilet at a later time.    Thank you for entrusting me with your care and choosing [facility]  Dr Hilarie Fredrickson

## 2022-09-17 NOTE — Progress Notes (Signed)
Susan Mejia is a 62 year old female with a history of symptomatic hemorrhoids, intermittent constipation with IBS who is here for banding.  She was here on 08/01/2022 for hemorrhoidal banding to the right posterior internal hemorrhoid.  She is since continue Metamucil and added Colace.  Bowel movements are much more regular.  Symptoms are improving even with 1 hemorrhoidal banding  Hemorrhoidal symptoms prior to banding: Rectal bleeding, prolapse and difficulty cleaning after bowel movements   PROCEDURE NOTE:  The patient presents with symptomatic grade 2 internal hemorrhoids, requesting rubber band ligation of her hemorrhoidal disease.  All risks, benefits and alternative forms of therapy were described and informed consent was obtained.   The anorectum was pre-medicated with 0.125% nitroglycerin ointment The decision was made to band the LL (RP banded at visit #1) internal hemorrhoid, and the Rapides was used to perform band ligation without complication.   Digital anorectal examination was then performed to assure proper positioning of the band, and to adjust the banded tissue as required.  The patient was discharged home without pain or other issues.  Dietary and behavioral recommendations were given and along with follow-up instructions.     The following adjunctive treatments were recommended: Continue Metamucil and Colace  The patient will return as scheduled for follow-up and possible additional banding as required. No complications were encountered and the patient tolerated the procedure well.

## 2022-10-29 ENCOUNTER — Other Ambulatory Visit: Payer: Self-pay | Admitting: Internal Medicine

## 2023-01-02 ENCOUNTER — Other Ambulatory Visit: Payer: Self-pay | Admitting: Internal Medicine

## 2023-03-05 ENCOUNTER — Encounter (INDEPENDENT_AMBULATORY_CARE_PROVIDER_SITE_OTHER): Payer: Self-pay

## 2023-04-20 ENCOUNTER — Encounter: Payer: BC Managed Care – PPO | Admitting: Internal Medicine

## 2023-04-22 ENCOUNTER — Ambulatory Visit (INDEPENDENT_AMBULATORY_CARE_PROVIDER_SITE_OTHER): Payer: BC Managed Care – PPO | Admitting: Internal Medicine

## 2023-04-22 ENCOUNTER — Encounter: Payer: Self-pay | Admitting: Internal Medicine

## 2023-04-22 VITALS — BP 106/76 | HR 67 | Temp 98.1°F | Ht 61.0 in | Wt 178.8 lb

## 2023-04-22 DIAGNOSIS — R739 Hyperglycemia, unspecified: Secondary | ICD-10-CM | POA: Diagnosis not present

## 2023-04-22 DIAGNOSIS — H6121 Impacted cerumen, right ear: Secondary | ICD-10-CM | POA: Diagnosis not present

## 2023-04-22 DIAGNOSIS — I10 Essential (primary) hypertension: Secondary | ICD-10-CM

## 2023-04-22 DIAGNOSIS — E559 Vitamin D deficiency, unspecified: Secondary | ICD-10-CM

## 2023-04-22 DIAGNOSIS — E7800 Pure hypercholesterolemia, unspecified: Secondary | ICD-10-CM | POA: Diagnosis not present

## 2023-04-22 DIAGNOSIS — Z0000 Encounter for general adult medical examination without abnormal findings: Secondary | ICD-10-CM | POA: Diagnosis not present

## 2023-04-22 DIAGNOSIS — E538 Deficiency of other specified B group vitamins: Secondary | ICD-10-CM | POA: Diagnosis not present

## 2023-04-22 DIAGNOSIS — Z00129 Encounter for routine child health examination without abnormal findings: Secondary | ICD-10-CM

## 2023-04-22 DIAGNOSIS — Z23 Encounter for immunization: Secondary | ICD-10-CM

## 2023-04-22 LAB — CBC WITH DIFFERENTIAL/PLATELET
Basophils Absolute: 0 10*3/uL (ref 0.0–0.1)
Basophils Relative: 0.3 % (ref 0.0–3.0)
Eosinophils Absolute: 0 10*3/uL (ref 0.0–0.7)
Eosinophils Relative: 0.4 % (ref 0.0–5.0)
HCT: 39.5 % (ref 36.0–46.0)
Hemoglobin: 12.8 g/dL (ref 12.0–15.0)
Lymphocytes Relative: 32.1 % (ref 12.0–46.0)
Lymphs Abs: 2.9 10*3/uL (ref 0.7–4.0)
MCHC: 32.3 g/dL (ref 30.0–36.0)
MCV: 89.1 fL (ref 78.0–100.0)
Monocytes Absolute: 0.7 10*3/uL (ref 0.1–1.0)
Monocytes Relative: 8 % (ref 3.0–12.0)
Neutro Abs: 5.3 10*3/uL (ref 1.4–7.7)
Neutrophils Relative %: 59.2 % (ref 43.0–77.0)
Platelets: 255 10*3/uL (ref 150.0–400.0)
RBC: 4.43 Mil/uL (ref 3.87–5.11)
RDW: 14.6 % (ref 11.5–15.5)
WBC: 9 10*3/uL (ref 4.0–10.5)

## 2023-04-22 LAB — BASIC METABOLIC PANEL
BUN: 12 mg/dL (ref 6–23)
CO2: 26 meq/L (ref 19–32)
Calcium: 10 mg/dL (ref 8.4–10.5)
Chloride: 102 meq/L (ref 96–112)
Creatinine, Ser: 0.87 mg/dL (ref 0.40–1.20)
GFR: 72.2 mL/min (ref >60.00)
Glucose, Bld: 86 mg/dL (ref 70–99)
Potassium: 3.9 meq/L (ref 3.5–5.1)
Sodium: 138 meq/L (ref 135–145)

## 2023-04-22 LAB — TSH: TSH: 0.77 u[IU]/mL (ref 0.35–5.50)

## 2023-04-22 LAB — HEPATIC FUNCTION PANEL
ALT: 30 U/L (ref 0–35)
AST: 25 U/L (ref 0–37)
Albumin: 4.6 g/dL (ref 3.5–5.2)
Alkaline Phosphatase: 79 U/L (ref 39–117)
Bilirubin, Direct: 0.1 mg/dL (ref 0.0–0.3)
Total Bilirubin: 0.5 mg/dL (ref 0.2–1.2)
Total Protein: 7.9 g/dL (ref 6.0–8.3)

## 2023-04-22 LAB — MICROALBUMIN / CREATININE URINE RATIO
Creatinine,U: 76.9 mg/dL
Microalb Creat Ratio: 0.9 mg/g (ref 0.0–30.0)
Microalb, Ur: 0.7 mg/dL (ref 0.0–1.9)

## 2023-04-22 LAB — URINALYSIS, ROUTINE W REFLEX MICROSCOPIC
Bilirubin Urine: NEGATIVE
Hgb urine dipstick: NEGATIVE
Ketones, ur: NEGATIVE
Leukocytes,Ua: NEGATIVE
Nitrite: NEGATIVE
Specific Gravity, Urine: 1.015 (ref 1.000–1.030)
Total Protein, Urine: NEGATIVE
Urine Glucose: NEGATIVE
Urobilinogen, UA: 0.2 (ref 0.0–1.0)
pH: 6 (ref 5.0–8.0)

## 2023-04-22 LAB — LIPID PANEL
Cholesterol: 198 mg/dL (ref 0–200)
HDL: 47.2 mg/dL (ref >39.00)
LDL Cholesterol: 106 mg/dL — ABNORMAL HIGH (ref 0–99)
NonHDL: 151.24
Total CHOL/HDL Ratio: 4
Triglycerides: 227 mg/dL — ABNORMAL HIGH (ref 0.0–149.0)
VLDL: 45.4 mg/dL — ABNORMAL HIGH (ref 0.0–40.0)

## 2023-04-22 LAB — VITAMIN B12: Vitamin B-12: 672 pg/mL (ref 211–911)

## 2023-04-22 LAB — HEMOGLOBIN A1C: Hgb A1c MFr Bld: 6.1 % (ref 4.6–6.5)

## 2023-04-22 LAB — VITAMIN D 25 HYDROXY (VIT D DEFICIENCY, FRACTURES): VITD: 41.66 ng/mL (ref 30.00–100.00)

## 2023-04-22 NOTE — Patient Instructions (Signed)
Your right ear was irrigated today  Please continue all other medications as before, and refills have been done if requested.  Please have the pharmacy call with any other refills you may need.  Please continue your efforts at being more active, low cholesterol diet, and weight control.  You are otherwise up to date with prevention measures today.  Please keep your appointments with your specialists as you may have planned  Please go to the LAB at the blood drawing area for the tests to be done  You will be contacted by phone if any changes need to be made immediately.  Otherwise, you will receive a letter about your results with an explanation, but please check with MyChart first.  Please make an Appointment to return for your 1 year visit, or sooner if needed

## 2023-04-22 NOTE — Progress Notes (Signed)
The test results show that your current treatment is OK, as the tests are stable.  Please continue the same plan.  There is no other need for change of treatment or further evaluation based on these results, at this time.  thanks 

## 2023-04-22 NOTE — Progress Notes (Signed)
Patient ID: Susan Mejia, female   DOB: 1960-09-17, 62 y.o.   MRN: [id number]         Chief Complaint:: wellness exam and right cerumen impaction, left leg cramps, htn, hld, hyperglycemia, lowv it Susan       HPI:  Susan Mejia is a 62 y.o. female here for wellness exam; for tdap at pharmacy, o/w up to date                        Also now with ongoing social stressors; Denies worsening depressive symptoms, suicidal ideation, or panic; Has occasional left calf cramping, not sure why recently, concerned about possible blood clots.  Has reduced right ear cerumen.  Pt denies chest pain, increased sob or doe, wheezing, orthopnea, PND, increased LE swelling, palpitations, dizziness or syncope.   Pt denies polydipsia, polyuria, or new focal neuro s/s.    Pt denies fever, wt loss, night sweats, loss of appetite, or other constitutional symptoms     Wt Readings from Last 3 Encounters:  04/22/23 178 lb 12.8 oz (81.1 kg)  09/17/22 169 lb 8 oz (76.9 kg)  08/01/22 168 lb (76.2 kg)   BP Readings from Last 3 Encounters:  04/22/23 106/76  09/17/22 96/64  08/01/22 104/86   Immunization History  Administered Date(s) Administered   Influenza, Seasonal, Injecte, Preservative Fre 04/22/2023   Influenza,inj,Quad PF,6+ Mos 04/23/2019, 05/23/2020, 04/09/2022   Influenza-Unspecified 05/21/2021   Moderna Covid-19 Fall Seasonal Vaccine 24yrs & older 03/31/2023   Moderna Sars-Covid-2 Vaccination 10/01/2019, 11/02/2019, 05/01/2020   Pfizer Covid-19 Vaccine Bivalent Booster 57yrs & up 08/09/2021   Zoster Recombinant(Shingrix) 09/12/2019, 12/13/2019   Health Maintenance Due  Topic Date Due   DTaP/Tdap/Td (1 - Tdap) Never done      Past Medical History:  Diagnosis Date   Cataract    Diverticulosis    Essential hypertension, benign 11/17/2013   Frequent headaches    Gallstones    GERD (gastroesophageal reflux disease)    Hyperlipidemia    diet controlled   Internal hemorrhoids    Obesity    Thyroid  disease    hyperactive after pregancy, normal now, no meds   Past Surgical History:  Procedure Laterality Date   CHOLECYSTECTOMY     COLONOSCOPY     TONSILLECTOMY      reports that she has never smoked. She has never used smokeless tobacco. She reports that she does not drink alcohol and does not use drugs. family history includes Cancer in her father; Diabetes in an other family member; Heart disease in her mother; Hypertension in her mother; Lung cancer in her father. No Known Allergies Current Outpatient Medications on File Prior to Visit  Medication Sig Dispense Refill   aspirin EC 81 MG tablet Take 1 tablet (81 mg total) by mouth daily. 90 tablet 11   atorvastatin (LIPITOR) 40 MG tablet Take 1 tablet by mouth once daily 90 tablet 3   benzonatate (TESSALON) 200 MG capsule Take 1 capsule (200 mg total) by mouth 2 (two) times daily as needed for cough. 20 capsule 0   Biotin 10272 MCG TABS      diphenhydramine-acetaminophen (TYLENOL PM) 25-500 MG TABS tablet Take 1 tablet by mouth at bedtime as needed (for sleep).     Fluocinolone Acetonide Scalp 0.01 % OIL Apply topically.     FLUoxetine (PROZAC) 20 MG capsule Take 20 mg by mouth daily.     fluticasone (CUTIVATE) 0.05 % cream  irbesartan-hydrochlorothiazide (AVALIDE) 150-12.5 MG tablet Take 1 tablet by mouth once daily 90 tablet 0   latanoprost (XALATAN) 0.005 % ophthalmic solution      levocetirizine (XYZAL) 5 MG tablet      pantoprazole (PROTONIX) 40 MG tablet Take 1 tablet (40 mg total) by mouth daily as needed. 90 tablet 3   trimethoprim-polymyxin b (POLYTRIM) ophthalmic solution 1 drop every 3 (three) hours.     No current facility-administered medications on file prior to visit.        ROS:  All others reviewed and negative.  Objective        PE:  BP 106/76 (BP Location: Left Arm, Patient Position: Sitting, Cuff Size: Normal)   Pulse 67   Temp 98.1 F (36.7 C) (Oral)   Ht 5\' 1"  (1.549 m)   Wt 178 lb 12.8 oz (81.1  kg)   SpO2 100%   BMI 33.78 kg/m                 Constitutional: Pt appears in NAD               HENT: Head: NCAT.                Right Ear: External ear normal.                 Left Ear: External ear normal.                Eyes: . Pupils are equal, round, and reactive to light. Conjunctivae and EOM are normal               Nose: without Susan/c or deformity               Neck: Neck supple. Gross normal ROM               Cardiovascular: Normal rate and regular rhythm.                 Pulmonary/Chest: Effort normal and breath sounds without rales or wheezing.                Abd:  Soft, NT, ND, + BS, no organomegaly               Neurological: Pt is alert. At baseline orientation, motor grossly intact               Skin: Skin is warm. No rashes, no other new lesions, LE edema - none;  left leg without tenderness, cords, swelling and o/w neurovascular intact               Psychiatric: Pt behavior is normal without agitation   Micro: none  Cardiac tracings I have personally interpreted today:  none  Pertinent Radiological findings (summarize): none   Lab Results  Component Value Date   WBC 9.0 04/22/2023   HGB 12.8 04/22/2023   HCT 39.5 04/22/2023   PLT 255.0 04/22/2023   GLUCOSE 86 04/22/2023   CHOL 198 04/22/2023   TRIG 227.0 (H) 04/22/2023   HDL 47.20 04/22/2023   LDLDIRECT 158.0 05/03/2015   LDLCALC 106 (H) 04/22/2023   ALT 30 04/22/2023   AST 25 04/22/2023   NA 138 04/22/2023   K 3.9 04/22/2023   CL 102 04/22/2023   CREATININE 0.87 04/22/2023   BUN 12 04/22/2023   CO2 26 04/22/2023   TSH 0.77 04/22/2023   HGBA1C 6.1 04/22/2023   MICROALBUR 0.7 04/22/2023   Assessment/Plan:  Susan Mejia  Susan Mejia is a 62 y.o. Black or African American [2] female with  has a past medical history of Cataract, Diverticulosis, Essential hypertension, benign (11/17/2013), Frequent headaches, Gallstones, GERD (gastroesophageal reflux disease), Hyperlipidemia, Internal hemorrhoids, Obesity, and  Thyroid disease.  Encounter for well adolescent visit without abnormal findings Age and sex appropriate education and counseling updated with regular exercise and diet Referrals for preventative services - none needed Immunizations addressed - for tdap at pharmacy Smoking counseling  - none needed Evidence for depression or other mood disorder - none significant Most recent labs reviewed. I have personally reviewed and have noted: 1) the patient's medical and social history 2) The patient's current medications and supplements 3) The patient's height, weight, and BMI have been recorded in the chart   Essential hypertension, benign BP Readings from Last 3 Encounters:  04/22/23 106/76  09/17/22 96/64  08/01/22 104/86   Stable, pt to continue medical treatment avalide 150 12.5 qd   Hyperglycemia Lab Results  Component Value Date   HGBA1C 6.1 04/22/2023   Stable, pt to continue current medical treatment  - diet, wt control   Impacted ear wax Right ear wax resolved today,  to f/u any worsening symptoms or concerns  Vitamin Susan deficiency Last vitamin Susan Lab Results  Component Value Date   VD25OH 41.66 04/22/2023   Stable, cont oral replacement  Followup: Return in about 1 year (around 04/21/2024).  Oliver Barre, MD 04/26/2023 12:21 PM [facility]

## 2023-04-26 ENCOUNTER — Encounter: Payer: Self-pay | Admitting: Internal Medicine

## 2023-04-26 NOTE — Assessment & Plan Note (Signed)
Last vitamin D Lab Results  Component Value Date   VD25OH 41.66 04/22/2023   Stable, cont oral replacement

## 2023-04-26 NOTE — Assessment & Plan Note (Signed)
BP Readings from Last 3 Encounters:  04/22/23 106/76  09/17/22 96/64  08/01/22 104/86   Stable, pt to continue medical treatment avalide 150 12.5 qd

## 2023-04-26 NOTE — Assessment & Plan Note (Signed)
Right ear wax resolved today,  to f/u any worsening symptoms or concerns

## 2023-04-26 NOTE — Assessment & Plan Note (Signed)

## 2023-04-26 NOTE — Assessment & Plan Note (Signed)
Lab Results  Component Value Date   HGBA1C 6.1 04/22/2023   Stable, pt to continue current medical treatment  - diet, wt control

## 2023-06-14 ENCOUNTER — Other Ambulatory Visit: Payer: Self-pay | Admitting: Internal Medicine

## 2023-06-15 ENCOUNTER — Other Ambulatory Visit: Payer: Self-pay

## 2023-08-26 ENCOUNTER — Other Ambulatory Visit: Payer: Self-pay | Admitting: Internal Medicine

## 2023-08-26 ENCOUNTER — Other Ambulatory Visit: Payer: Self-pay

## 2023-08-28 ENCOUNTER — Ambulatory Visit (INDEPENDENT_AMBULATORY_CARE_PROVIDER_SITE_OTHER): Payer: 59 | Admitting: Family Medicine

## 2023-08-28 ENCOUNTER — Encounter: Payer: Self-pay | Admitting: Family Medicine

## 2023-08-28 VITALS — BP 124/78 | HR 70 | Temp 97.6°F | Ht 61.0 in

## 2023-08-28 DIAGNOSIS — R5383 Other fatigue: Secondary | ICD-10-CM

## 2023-08-28 DIAGNOSIS — R062 Wheezing: Secondary | ICD-10-CM

## 2023-08-28 DIAGNOSIS — J22 Unspecified acute lower respiratory infection: Secondary | ICD-10-CM

## 2023-08-28 MED ORDER — AZITHROMYCIN 250 MG PO TABS: ORAL_TABLET | ORAL | 0 refills | Status: AC

## 2023-08-28 MED ORDER — PROMETHAZINE-DM 6.25-15 MG/5ML PO SYRP: 5.0000 mL | ORAL_SOLUTION | Freq: Four times a day (QID) | ORAL | 0 refills | Status: AC | PRN

## 2023-08-28 MED ORDER — ALBUTEROL SULFATE HFA 108 (90 BASE) MCG/ACT IN AERS: 2.0000 | INHALATION_SPRAY | Freq: Four times a day (QID) | RESPIRATORY_TRACT | 0 refills | Status: AC | PRN

## 2023-08-28 MED ORDER — PREDNISONE 20 MG PO TABS: 40.0000 mg | ORAL_TABLET | Freq: Every day | ORAL | 0 refills | Status: AC

## 2023-08-28 NOTE — Progress Notes (Signed)
 Subjective:  Susan Mejia is a 63 y.o. female who presents for a 1 to 2-week history of fatigue, chills, cough that is becoming productive, wheezing and occasional chest tightness.  Reports history of bronchitis.  Denies history of asthma or COPD.  Denies smoking.  Denies any recent antibiotics.   Denies fever, chest pain, palpitations, abdominal pain, nausea  ROS as in subjective.   Objective: Vitals:   08/28/23 1446  BP: 124/78  Pulse: 70  Temp: 97.6 F (36.4 C)  SpO2: 94%    General appearance: Alert, WD/WN, no distress, mildly ill appearing                             Skin: warm, no rash                           Head: no sinus tenderness                            Eyes: conjunctiva normal, corneas clear, PERRLA                            Ears: pearly TMs, external ear canals normal                          Nose: septum midline, turbinates swollen, with erythema and clear discharge             Mouth/throat: MMM, tongue normal, mild pharyngeal erythema                           Neck: supple, no adenopathy, no thyromegaly, nontender                          Heart: RRR                         Lungs: Scattered rhonchi and wheezes, worse left lower lobe      Assessment: Lower respiratory infection  Wheezing  Fatigue, unspecified type   Plan: No respiratory infection, worsening. azithromycin  prescribed.  She is wheezing.  Albuterol  inhaler and oral steroids prescribed.  Unable to sleep and request cough syrup.  Promethazine  DM prescribed.  Follow-up if worsening or no improvement in the next 3 to 4 days.

## 2023-08-28 NOTE — Patient Instructions (Signed)
 Take the antibiotic and oral steroid as prescribed with food and water.  Use the albuterol  inhaler as needed for cough, wheezing and shortness of breath.  You may take the cough syrup at bedtime but be aware this medication is sedating.  Do not drive, operate machinery, take other sedating medications or drink alcohol when taking this medication.

## 2023-11-29 ENCOUNTER — Other Ambulatory Visit: Payer: Self-pay | Admitting: Internal Medicine

## 2023-11-30 ENCOUNTER — Other Ambulatory Visit: Payer: Self-pay

## 2024-04-25 ENCOUNTER — Other Ambulatory Visit: Payer: Self-pay

## 2024-04-25 MED ORDER — IRBESARTAN-HYDROCHLOROTHIAZIDE 150-12.5 MG PO TABS: 1.0000 | ORAL_TABLET | Freq: Every day | ORAL | 3 refills | Status: DC

## 2024-07-12 ENCOUNTER — Encounter: Admitting: Internal Medicine

## 2024-07-12 ENCOUNTER — Ambulatory Visit: Admitting: Internal Medicine

## 2024-07-12 ENCOUNTER — Encounter: Payer: Self-pay | Admitting: Internal Medicine

## 2024-07-12 VITALS — BP 120/78 | HR 69 | Temp 98.3°F | Ht 61.0 in | Wt 176.0 lb

## 2024-07-12 DIAGNOSIS — Z0000 Encounter for general adult medical examination without abnormal findings: Secondary | ICD-10-CM

## 2024-07-12 DIAGNOSIS — R739 Hyperglycemia, unspecified: Secondary | ICD-10-CM

## 2024-07-12 DIAGNOSIS — L988 Other specified disorders of the skin and subcutaneous tissue: Secondary | ICD-10-CM

## 2024-07-12 DIAGNOSIS — I10 Essential (primary) hypertension: Secondary | ICD-10-CM | POA: Diagnosis not present

## 2024-07-12 DIAGNOSIS — E559 Vitamin D deficiency, unspecified: Secondary | ICD-10-CM | POA: Diagnosis not present

## 2024-07-12 DIAGNOSIS — E785 Hyperlipidemia, unspecified: Secondary | ICD-10-CM

## 2024-07-12 DIAGNOSIS — M5412 Radiculopathy, cervical region: Secondary | ICD-10-CM

## 2024-07-12 DIAGNOSIS — E538 Deficiency of other specified B group vitamins: Secondary | ICD-10-CM

## 2024-07-12 DIAGNOSIS — Z00129 Encounter for routine child health examination without abnormal findings: Secondary | ICD-10-CM

## 2024-07-12 LAB — BASIC METABOLIC PANEL WITH GFR
BUN: 16 mg/dL (ref 6–23)
CO2: 27 meq/L (ref 19–32)
Calcium: 10 mg/dL (ref 8.4–10.5)
Chloride: 100 meq/L (ref 96–112)
Creatinine, Ser: 0.93 mg/dL (ref 0.40–1.20)
GFR: 66.08 mL/min
Glucose, Bld: 89 mg/dL (ref 70–99)
Potassium: 4.1 meq/L (ref 3.5–5.1)
Sodium: 136 meq/L (ref 135–145)

## 2024-07-12 LAB — HEPATIC FUNCTION PANEL
ALT: 22 U/L (ref 3–35)
AST: 19 U/L (ref 5–37)
Albumin: 4.8 g/dL (ref 3.5–5.2)
Alkaline Phosphatase: 87 U/L (ref 39–117)
Bilirubin, Direct: 0.1 mg/dL (ref 0.1–0.3)
Total Bilirubin: 0.3 mg/dL (ref 0.2–1.2)
Total Protein: 8.1 g/dL (ref 6.0–8.3)

## 2024-07-12 LAB — LIPID PANEL
Cholesterol: 168 mg/dL (ref 28–200)
HDL: 49.6 mg/dL
LDL Cholesterol: 92 mg/dL (ref 10–99)
NonHDL: 118.17
Total CHOL/HDL Ratio: 3
Triglycerides: 130 mg/dL (ref 10.0–149.0)
VLDL: 26 mg/dL (ref 0.0–40.0)

## 2024-07-12 LAB — CBC WITH DIFFERENTIAL/PLATELET
Basophils Absolute: 0 K/uL (ref 0.0–0.1)
Basophils Relative: 0.6 % (ref 0.0–3.0)
Eosinophils Absolute: 0.1 K/uL (ref 0.0–0.7)
Eosinophils Relative: 0.6 % (ref 0.0–5.0)
HCT: 41 % (ref 36.0–46.0)
Hemoglobin: 13.4 g/dL (ref 12.0–15.0)
Lymphocytes Relative: 35.5 % (ref 12.0–46.0)
Lymphs Abs: 3.2 K/uL (ref 0.7–4.0)
MCHC: 32.8 g/dL (ref 30.0–36.0)
MCV: 87.5 fl (ref 78.0–100.0)
Monocytes Absolute: 0.6 K/uL (ref 0.1–1.0)
Monocytes Relative: 6.4 % (ref 3.0–12.0)
Neutro Abs: 5.1 K/uL (ref 1.4–7.7)
Neutrophils Relative %: 56.9 % (ref 43.0–77.0)
Platelets: 259 K/uL (ref 150.0–400.0)
RBC: 4.68 Mil/uL (ref 3.87–5.11)
RDW: 14.3 % (ref 11.5–15.5)
WBC: 8.9 K/uL (ref 4.0–10.5)

## 2024-07-12 LAB — TSH: TSH: 1.18 u[IU]/mL (ref 0.35–5.50)

## 2024-07-12 LAB — VITAMIN D 25 HYDROXY (VIT D DEFICIENCY, FRACTURES): VITD: 43.81 ng/mL (ref 30.00–100.00)

## 2024-07-12 LAB — HEMOGLOBIN A1C: Hgb A1c MFr Bld: 6.2 % (ref 4.6–6.5)

## 2024-07-12 MED ORDER — PANTOPRAZOLE SODIUM 40 MG PO TBEC: 40.0000 mg | DELAYED_RELEASE_TABLET | Freq: Every day | ORAL | 3 refills | Status: AC | PRN

## 2024-07-12 MED ORDER — IRBESARTAN-HYDROCHLOROTHIAZIDE 150-12.5 MG PO TABS: 1.0000 | ORAL_TABLET | Freq: Every day | ORAL | 3 refills | Status: AC

## 2024-07-12 MED ORDER — ATORVASTATIN CALCIUM 80 MG PO TABS: 80.0000 mg | ORAL_TABLET | Freq: Every day | ORAL | 3 refills | Status: AC

## 2024-07-12 NOTE — Progress Notes (Signed)
 Patient ID: Susan Mejia, female   DOB: 1961/03/20, 63 y.o.   MRN: [id number]         Chief Complaint:: wellness exam and Annual Exam (Headaches that come and go has been going on for the past couple of weeks and been having some aching in her arm and fingers and also noticed a mole on her left breast about 2 weeks ago )  , hld, obesity, right cervical radiculopathy, low vit d, hyperglycemia, htn       HPI:  Susan Mejia is a 63 y.o. female here for wellness exam; declines prevnar 20, o/w up to date                        Also Has mole to tail of left breast x 2 mo, asks for derm referral.  Has been gaining wt but might consider starting oral GLP1 when approved next month.  Good compliance with lipitor 40 mg and low chol diet.  Pt denies chest pain, increased sob or doe, wheezing, orthopnea, PND, increased LE swelling, palpitations, dizziness or syncope.  Pt denies polydipsia, polyuria,   Does have recurring right head pain, neck pain at the base, and radiation to the distal RUE weiht pain and numbness, and maybe mild intermittent weakness.     Wt Readings from Last 3 Encounters:  07/12/24 176 lb (79.8 kg)  04/22/23 178 lb 12.8 oz (81.1 kg)  09/17/22 169 lb 8 oz (76.9 kg)   BP Readings from Last 3 Encounters:  07/12/24 120/78  08/28/23 124/78  04/22/23 106/76   Immunization History  Administered Date(s) Administered   Influenza, Seasonal, Injecte, Preservative Fre 04/22/2023   Influenza,inj,Quad PF,6+ Mos 04/23/2019, 05/23/2020, 04/09/2022   Influenza,trivalent, recombinat, inj, PF 04/14/2024   Influenza-Unspecified 05/21/2021   Moderna Covid-19 Fall Seasonal Vaccine 46yrs & older 08/27/2022, 03/31/2023, 04/14/2024   Moderna Sars-Covid-2 Vaccination 10/01/2019, 11/02/2019, 05/01/2020   Pfizer Covid-19 Vaccine Bivalent Booster 32yrs & up 08/09/2021   Respiratory Syncytial Virus Vaccine,Recomb Aduvanted(Arexvy) 08/27/2022   Tdap 05/11/2023   Zoster Recombinant(Shingrix)  08/04/2019, 09/12/2019, 12/13/2019   Health Maintenance Due  Topic Date Due   Pneumococcal Vaccine: 50+ Years (1 of 1 - PCV) Never done      Past Medical History:  Diagnosis Date   Cataract    Diverticulosis    Essential hypertension, benign 11/17/2013   Frequent headaches    Gallstones    GERD (gastroesophageal reflux disease)    Hyperlipidemia    diet controlled   Internal hemorrhoids    Obesity    Thyroid  disease    hyperactive after pregancy, normal now, no meds   Past Surgical History:  Procedure Laterality Date   CHOLECYSTECTOMY     COLONOSCOPY     TONSILLECTOMY      reports that she has never smoked. She has never used smokeless tobacco. She reports that she does not drink alcohol and does not use drugs. family history includes Cancer in her father; Diabetes in an other family member; Heart disease in her mother; Hypertension in her mother; Lung cancer in her father. Allergies[1] Medications Ordered Prior to Encounter[2]      ROS:  All others reviewed and negative.  Objective        PE:  BP 120/78 (BP Location: Left Arm, Patient Position: Sitting, Cuff Size: Normal)   Pulse 69   Temp 98.3 F (36.8 C) (Oral)   Ht 5' 1 (1.549 m)   Wt 176 lb (79.8  kg)   SpO2 99%   BMI 33.25 kg/m                 Constitutional: Pt appears in NAD               HENT: Head: NCAT.                Right Ear: External ear normal.                 Left Ear: External ear normal.                Eyes: . Pupils are equal, round, and reactive to light. Conjunctivae and EOM are normal               Nose: without d/c or deformity               Neck: Neck supple. Gross normal ROM               Cardiovascular: Normal rate and regular rhythm.                 Pulmonary/Chest: Effort normal and breath sounds without rales or wheezing.                Abd:  Soft, NT, ND, + BS, no organomegaly               Neurological: Pt is alert. At baseline orientation, motor grossly intact                Skin: Skin is warm. No rashes, LE edema - none               Psychiatric: Pt behavior is normal without agitation   Micro: none  Cardiac tracings I have personally interpreted today:  none  Pertinent Radiological findings (summarize): none   Lab Results  Component Value Date   WBC 8.9 07/12/2024   HGB 13.4 07/12/2024   HCT 41.0 07/12/2024   PLT 259.0 07/12/2024   GLUCOSE 89 07/12/2024   CHOL 168 07/12/2024   TRIG 130.0 07/12/2024   HDL 49.60 07/12/2024   LDLDIRECT 158.0 05/03/2015   LDLCALC 92 07/12/2024   ALT 22 07/12/2024   AST 19 07/12/2024   NA 136 07/12/2024   K 4.1 07/12/2024   CL 100 07/12/2024   CREATININE 0.93 07/12/2024   BUN 16 07/12/2024   CO2 27 07/12/2024   TSH 1.18 07/12/2024   HGBA1C 6.2 07/12/2024   Assessment/Plan:  Susan Mejia is a 63 y.o. Black or African American [2] female with  has a past medical history of Cataract, Diverticulosis, Essential hypertension, benign (11/17/2013), Frequent headaches, Gallstones, GERD (gastroesophageal reflux disease), Hyperlipidemia, Internal hemorrhoids, Obesity, and Thyroid  disease.  Encounter for well adolescent visit without abnormal findings Age and sex appropriate education and counseling updated with regular exercise and diet Referrals for preventative services - none needed Immunizations addressed - declines prevnar 20 Smoking counseling  - none needed Evidence for depression or other mood disorder - none significant Most recent labs reviewed. I have personally reviewed and have noted: 1) the patient's medical and social history 2) The patient's current medications and supplements 3) The patient's height, weight, and BMI have been recorded in the chart   Vitamin D  deficiency Last vitamin D  Lab Results  Component Value Date   VD25OH 43.81 07/12/2024   Stable, cont oral replacement   Hyperlipidemia Lab Results  Component Value Date   LDLCALC 92 07/12/2024  Uncontrolled, pt to increase  lipitor to 80 mg qd   Hyperglycemia Lab Results  Component Value Date   HGBA1C 6.2 07/12/2024   Stable, pt to continue current medical treatment  - diet, wt control   Essential hypertension, benign BP Readings from Last 3 Encounters:  07/12/24 120/78  08/28/23 124/78  04/22/23 106/76   Stable, pt to continue medical treatment avalide 150 12.5 qd   Skin lesion of breast Ok for referral dermatology as requested  Radiculitis of right cervical region Mild to mod, declines MRI or ortho for now,,  to f/u any worsening symptoms or concerns  Followup: Return in about 1 year (around 07/12/2025).  Lynwood Rush, MD 07/15/2024 4:16 PM [facility]  Primary Care - [facility]    [1] No Known Allergies [2]  Current Outpatient Medications on File Prior to Visit  Medication Sig Dispense Refill   albuterol  (VENTOLIN  HFA) 108 (90 Base) MCG/ACT inhaler Inhale 2 puffs into the lungs every 6 (six) hours as needed for wheezing or shortness of breath. 8 g 0   aspirin  EC 81 MG tablet Take 1 tablet (81 mg total) by mouth daily. 90 tablet 11   Biotin 10000 MCG TABS      diphenhydramine-acetaminophen (TYLENOL PM) 25-500 MG TABS tablet Take 1 tablet by mouth at bedtime as needed (for sleep).     Fluocinolone Acetonide Scalp 0.01 % OIL Apply topically.     FLUoxetine (PROZAC) 20 MG capsule Take 20 mg by mouth daily.     fluticasone (CUTIVATE) 0.05 % cream      latanoprost (XALATAN) 0.005 % ophthalmic solution      levocetirizine (XYZAL) 5 MG tablet      predniSONE  (DELTASONE ) 20 MG tablet Take 2 tablets (40 mg total) by mouth daily with breakfast. 10 tablet 0   promethazine -dextromethorphan (PROMETHAZINE -DM) 6.25-15 MG/5ML syrup Take 5 mLs by mouth 4 (four) times daily as needed for cough. 118 mL 0   trimethoprim-polymyxin b (POLYTRIM) ophthalmic solution 1 drop every 3 (three) hours.     No current facility-administered medications on file prior to visit.

## 2024-07-12 NOTE — Patient Instructions (Signed)
 Ok to increase the lipitor to 80 mg per day  Please call next month (or Mychart message) if you would want the new GLP1 pill  Please continue all other medications as before, and refills have been done if requested.  Please have the pharmacy call with any other refills you may need.  Please continue your efforts at being more active, low cholesterol diet, and weight control.  You are otherwise up to date with prevention measures today.  Please keep your appointments with your specialists as you may have planned  You will be contacted regarding the referral for: Dermatology  Please go to the LAB at the blood drawing area for the tests to be done  You will be contacted by phone if any changes need to be made immediately.  Otherwise, you will receive a letter about your results with an explanation, but please check with MyChart first.  Please make an Appointment to return for your 1 year visit, or sooner if needed

## 2024-07-13 ENCOUNTER — Ambulatory Visit: Payer: Self-pay | Admitting: Internal Medicine

## 2024-07-13 LAB — URINALYSIS, ROUTINE W REFLEX MICROSCOPIC
Bilirubin Urine: NEGATIVE
Hgb urine dipstick: NEGATIVE
Ketones, ur: NEGATIVE
Leukocytes,Ua: NEGATIVE
Nitrite: NEGATIVE
RBC / HPF: NONE SEEN
Specific Gravity, Urine: 1.015 (ref 1.000–1.030)
Total Protein, Urine: NEGATIVE
Urine Glucose: NEGATIVE
Urobilinogen, UA: 0.2 (ref 0.0–1.0)
WBC, UA: NONE SEEN
pH: 6 (ref 5.0–8.0)

## 2024-07-13 NOTE — Progress Notes (Signed)
 The test results show that your current treatment is OK, as the tests are stable.  Please continue the same plan.  There is no other need for change of treatment or further evaluation based on these results, at this time.  thanks

## 2024-07-15 ENCOUNTER — Encounter: Payer: Self-pay | Admitting: Internal Medicine

## 2024-07-15 DIAGNOSIS — L988 Other specified disorders of the skin and subcutaneous tissue: Secondary | ICD-10-CM | POA: Insufficient documentation

## 2024-07-15 DIAGNOSIS — M5412 Radiculopathy, cervical region: Secondary | ICD-10-CM | POA: Insufficient documentation

## 2024-07-15 NOTE — Assessment & Plan Note (Signed)
 Ok for referral dermatology as requested

## 2024-07-15 NOTE — Assessment & Plan Note (Signed)
 Last vitamin D  Lab Results  Component Value Date   VD25OH 43.81 07/12/2024   Stable, cont oral replacement

## 2024-07-15 NOTE — Assessment & Plan Note (Signed)
 Age and sex appropriate education and counseling updated with regular exercise and diet Referrals for preventative services - none needed Immunizations addressed - declines prevnar 20 Smoking counseling  - none needed Evidence for depression or other mood disorder - none significant Most recent labs reviewed. I have personally reviewed and have noted: 1) the patient's medical and social history 2) The patient's current medications and supplements 3) The patient's height, weight, and BMI have been recorded in the chart

## 2024-07-15 NOTE — Assessment & Plan Note (Signed)
 BP Readings from Last 3 Encounters:  07/12/24 120/78  08/28/23 124/78  04/22/23 106/76   Stable, pt to continue medical treatment avalide 150 12.5 qd

## 2024-07-15 NOTE — Assessment & Plan Note (Signed)
 Mild to mod, declines MRI or ortho for now,,  to f/u any worsening symptoms or concerns

## 2024-07-15 NOTE — Assessment & Plan Note (Addendum)
 Lab Results  Component Value Date   LDLCALC 92 07/12/2024   Uncontrolled, pt to increase lipitor to 80 mg qd

## 2024-07-15 NOTE — Assessment & Plan Note (Signed)
 Lab Results  Component Value Date   HGBA1C 6.2 07/12/2024   Stable, pt to continue current medical treatment  - diet, wt control

## 2025-03-21 ENCOUNTER — Ambulatory Visit: Admitting: Physician Assistant
